# Patient Record
Sex: Male | Born: 1980 | Race: White | Hispanic: No | Marital: Single | Smoking: Current every day smoker
Health system: Southern US, Community
[De-identification: ages and names within clinical notes are randomized; demographics above are authoritative.]

## PROBLEM LIST (undated history)

## (undated) HISTORY — PX: DENTAL SURGERY: SHX609

---

## 2005-03-28 ENCOUNTER — Emergency Department: Payer: Self-pay | Admitting: Emergency Medicine

## 2007-02-06 ENCOUNTER — Emergency Department: Payer: Self-pay | Admitting: Emergency Medicine

## 2009-05-04 ENCOUNTER — Emergency Department: Payer: Self-pay

## 2012-03-25 ENCOUNTER — Emergency Department: Payer: Self-pay | Admitting: Emergency Medicine

## 2012-03-27 LAB — BETA STREP CULTURE(ARMC)

## 2012-06-25 ENCOUNTER — Emergency Department: Payer: Self-pay | Admitting: Emergency Medicine

## 2012-06-25 LAB — CBC
MCH: 31.1 pg (ref 26.0–34.0)
MCHC: 35.4 g/dL (ref 32.0–36.0)
MCV: 88 fL (ref 80–100)
Platelet: 169 10*3/uL (ref 150–440)
RBC: 4.8 10*6/uL (ref 4.40–5.90)
RDW: 12.8 % (ref 11.5–14.5)
WBC: 12.2 10*3/uL — ABNORMAL HIGH (ref 3.8–10.6)

## 2012-06-25 LAB — URINALYSIS, COMPLETE
Bacteria: NONE SEEN
Bilirubin,UR: NEGATIVE
Blood: NEGATIVE
Ketone: NEGATIVE
Leukocyte Esterase: NEGATIVE
RBC,UR: 8 /HPF (ref 0–5)
Squamous Epithelial: NONE SEEN
WBC UR: 3 /HPF (ref 0–5)

## 2012-06-25 LAB — COMPREHENSIVE METABOLIC PANEL
Albumin: 4 g/dL (ref 3.4–5.0)
Alkaline Phosphatase: 95 U/L (ref 50–136)
Anion Gap: 10 (ref 7–16)
BUN: 10 mg/dL (ref 7–18)
Calcium, Total: 8.7 mg/dL (ref 8.5–10.1)
EGFR (Non-African Amer.): >60
SGOT(AST): 39 U/L — ABNORMAL HIGH (ref 15–37)
Sodium: 141 mmol/L (ref 136–145)
Total Protein: 7.4 g/dL (ref 6.4–8.2)

## 2012-06-25 LAB — DRUG SCREEN, URINE
Amphetamines, Ur Screen: NEGATIVE (ref ?–1000)
Barbiturates, Ur Screen: NEGATIVE (ref ?–200)
Benzodiazepine, Ur Scrn: NEGATIVE (ref ?–200)
Methadone, Ur Screen: NEGATIVE (ref ?–300)
Tricyclic, Ur Screen: NEGATIVE (ref ?–1000)

## 2014-04-09 ENCOUNTER — Emergency Department: Payer: Self-pay | Admitting: Emergency Medicine

## 2014-04-14 ENCOUNTER — Emergency Department: Payer: Self-pay | Admitting: Emergency Medicine

## 2014-04-14 LAB — URINALYSIS, COMPLETE
Bacteria: NONE SEEN
Bilirubin,UR: NEGATIVE
Blood: NEGATIVE
GLUCOSE, UR: NEGATIVE mg/dL (ref 0–75)
Ketone: NEGATIVE
LEUKOCYTE ESTERASE: NEGATIVE
Nitrite: NEGATIVE
PH: 5 (ref 4.5–8.0)
PROTEIN: NEGATIVE
SPECIFIC GRAVITY: 1.024 (ref 1.003–1.030)
WBC UR: 1 /HPF (ref 0–5)

## 2014-04-14 LAB — ACETAMINOPHEN LEVEL: Acetaminophen: <2 ug/mL

## 2014-04-14 LAB — CBC
HCT: 44.3 % (ref 40.0–52.0)
HGB: 15.1 g/dL (ref 13.0–18.0)
MCH: 31 pg (ref 26.0–34.0)
MCHC: 34 g/dL (ref 32.0–36.0)
MCV: 91 fL (ref 80–100)
PLATELETS: 223 10*3/uL (ref 150–440)
RBC: 4.86 10*6/uL (ref 4.40–5.90)
RDW: 13.2 % (ref 11.5–14.5)
WBC: 11.7 10*3/uL — AB (ref 3.8–10.6)

## 2014-04-14 LAB — DRUG SCREEN, URINE

## 2014-04-14 LAB — COMPREHENSIVE METABOLIC PANEL
ALK PHOS: 92 U/L
AST: 28 U/L (ref 15–37)
Albumin: 4.1 g/dL (ref 3.4–5.0)
Anion Gap: 5 — ABNORMAL LOW (ref 7–16)
BILIRUBIN TOTAL: 0.5 mg/dL (ref 0.2–1.0)
BUN: 21 mg/dL — AB (ref 7–18)
CO2: 28 mmol/L (ref 21–32)
Calcium, Total: 8.8 mg/dL (ref 8.5–10.1)
Chloride: 105 mmol/L (ref 98–107)
Creatinine: 0.96 mg/dL (ref 0.60–1.30)
EGFR (African American): >60
Glucose: 83 mg/dL (ref 65–99)
Osmolality: 278 (ref 275–301)
Potassium: 4.2 mmol/L (ref 3.5–5.1)
SGPT (ALT): 27 U/L
Sodium: 138 mmol/L (ref 136–145)
Total Protein: 8.2 g/dL (ref 6.4–8.2)

## 2014-04-14 LAB — ETHANOL
Ethanol %: 0.006 % (ref 0.000–0.080)
Ethanol: 6 mg/dL

## 2014-04-14 LAB — SALICYLATE LEVEL: SALICYLATES, SERUM: 2.6 mg/dL

## 2014-05-14 ENCOUNTER — Emergency Department: Payer: Self-pay | Admitting: Emergency Medicine
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
## 2016-11-08 ENCOUNTER — Emergency Department
Admission: EM | Admit: 2016-11-08 | Discharge: 2016-11-08 | Disposition: A | Discharge status: Home / Self Care | Payer: PRIVATE HEALTH INSURANCE | Attending: Emergency Medicine | Admitting: Emergency Medicine

## 2016-11-08 ENCOUNTER — Encounter: Payer: Self-pay | Admitting: Emergency Medicine

## 2016-11-08 DIAGNOSIS — K05219 Aggressive periodontitis, localized, unspecified severity: Secondary | ICD-10-CM

## 2016-11-08 DIAGNOSIS — K0889 Other specified disorders of teeth and supporting structures: Secondary | ICD-10-CM | POA: Diagnosis present

## 2016-11-08 DIAGNOSIS — K659 Peritonitis, unspecified: Secondary | ICD-10-CM | POA: Insufficient documentation

## 2016-11-08 MED ORDER — AMOXICILLIN 500 MG PO TABS: 500.0000 mg | ORAL_TABLET | Freq: Three times a day (TID) | ORAL | 0 refills | Status: DC

## 2016-11-08 MED ORDER — TRAMADOL HCL 50 MG PO TABS: 50.0000 mg | ORAL_TABLET | Freq: Four times a day (QID) | ORAL | 0 refills | Status: DC | PRN

## 2016-11-08 NOTE — ED Provider Notes (Signed)
[facility] Emergency Department Provider Note ____________________________________________  Time seen: Approximately 9:52 PM  I have reviewed the triage vital signs and the nursing notes.   HISTORY  Chief Complaint Dental Pain   HPI DAGAN HEINZ is a 36 y.o. male who presents to the emergency department for evaluation of dental pain. He had his wisdom teeth removed about 6 months ago and for the past week or so, he has had pain, swelling, and a small recurrent abscess that bursts if he touches it was tongue. Pain has increased over the past 24 hours. He has had no facial swelling. He states that he has a pressure-like feeling as if there is a piece of bone or tooth that is trying to push through the gum.  History reviewed. No pertinent past medical history.  There are no active problems to display for this patient.   Past Surgical History:  Procedure Laterality Date  . DENTAL SURGERY      Prior to Admission medications   Medication Sig Start Date End Date Taking? Authorizing Provider  amoxicillin (AMOXIL) 500 MG tablet Take 1 tablet (500 mg total) by mouth 3 (three) times daily. 11/08/16   Chinita Pester, FNP  traMADol (ULTRAM) 50 MG tablet Take 1 tablet (50 mg total) by mouth every 6 (six) hours as needed. 11/08/16   Chinita Pester, FNP    Allergies Patient has no known allergies.  No family history on file.  Social History Social History  Substance Use Topics  . Smoking status: Not on file  . Smokeless tobacco: Not on file  . Alcohol use Not on file    Review of Systems Constitutional: Well appearing. Negative for fever. ENT: Eyes negative for gingival pain and swelling. Musculoskeletal: Negative for jaw pain/trismus. Skin: Negative for facial erythema or swelling. ____________________________________________   PHYSICAL EXAM:  VITAL SIGNS: ED Triage Vitals  Enc Vitals Group     BP 11/08/16 2133 140/80     Pulse Rate 11/08/16 2133  98     Resp 11/08/16 2133 20     Temp 11/08/16 2133 99 F (37.2 C)     Temp Source 11/08/16 2133 Oral     SpO2 11/08/16 2133 96 %     Weight 11/08/16 2133 200 lb (90.7 kg)     Height 11/08/16 2133 5\' 10"  (1.778 m)     Head Circumference --      Peak Flow --      Pain Score 11/08/16 2132 7     Pain Loc --      Pain Edu? --      Excl. in GC? --     Constitutional: Alert and oriented. Well appearing and in no acute distress. Eyes: Conjunctivae are normal.EOMI. Mouth/Throat: Mucous membranes are moist. Oropharynx non-erythematous. Periodontal Exam    Hematological/Lymphatic/Immunilogical: No cervical lymphadenopathy. Respiratory: Normal respiratory effort.  Musculoskeletal: Full ROM x 4 extremities. Neurologic:  Normal speech and language. No gross focal neurologic deficits are appreciated. Speech is normal. No gait instability. Psychiatric: Mood and affect are normal. Speech and behavior are normal.  ____________________________________________   LABS (all labs ordered are listed, but only abnormal results are displayed)  Labs Reviewed - No data to display ____________________________________________   RADIOLOGY  Not indicated ____________________________________________   PROCEDURES  Procedure(s) performed: None  Critical Care performed: No  ____________________________________________   INITIAL IMPRESSION / ASSESSMENT AND PLAN / ED COURSE  Pertinent labs & imaging results that were available during my care of  the patient were reviewed by me and considered in my medical decision making (see chart for details).  36 year old male presenting to the emergency department for treatment of gingival pain and abscess. He was given a prescription for amoxicillin and tramadol and advised to call his oral surgeon for follow-up. He was advised to call the dentist if he is unable to get an appointment with the oral surgeon. He was advised to take the antibiotic as prescribed.  He was instructed to return to the emergency department for symptoms that change or worsen if he is unable schedule an appointment. ____________________________________________   FINAL CLINICAL IMPRESSION(S) / ED DIAGNOSES  Final diagnoses:  Gingival abscess    Note:  This document was prepared using Dragon voice recognition software and may include unintentional dictation errors.    Chinita PesterCari B Mateen Franssen, FNP 11/08/16 2315    Merrily BrittleNeil Rifenbark, MD 11/08/16 2350

## 2016-11-08 NOTE — ED Triage Notes (Signed)
Pt presents to ED with c/o right sided dental pain. Pt states he had a wisdom tooth extracted about 6 months ago and for the past week or so he has noticed an increase in swelling and pain to the same area.

## 2016-11-08 NOTE — Discharge Instructions (Signed)
Rinse your mouth out 4 times per day with warm salt water. Call and schedule an appointment with the oral surgeon. Take the antibiotic until finished. Return to the ER for symptoms that change or worsen if you are unable to schedule an appointment.

## 2016-11-08 NOTE — ED Notes (Signed)
Patient had wisdom teeth removed about 8-9 months ago. For the last week having pain in the upper right jaw, feels like something protruding down. Wants something for the pain and for us to refer him to a nice dentist.

## 2017-05-29 ENCOUNTER — Emergency Department
Admission: EM | Admit: 2017-05-29 | Discharge: 2017-05-31 | Disposition: A | Discharge status: Other Acute Inpatient Hospital | Payer: Medicaid - Out of State | Attending: Emergency Medicine | Admitting: Emergency Medicine

## 2017-05-29 DIAGNOSIS — F29 Unspecified psychosis not due to a substance or known physiological condition: Secondary | ICD-10-CM | POA: Diagnosis present

## 2017-05-29 DIAGNOSIS — Z79899 Other long term (current) drug therapy: Secondary | ICD-10-CM | POA: Diagnosis not present

## 2017-05-29 DIAGNOSIS — F312 Bipolar disorder, current episode manic severe with psychotic features: Secondary | ICD-10-CM

## 2017-05-29 DIAGNOSIS — F17200 Nicotine dependence, unspecified, uncomplicated: Secondary | ICD-10-CM | POA: Diagnosis not present

## 2017-05-29 LAB — COMPREHENSIVE METABOLIC PANEL
ALT: 21 U/L (ref 17–63)
AST: 28 U/L (ref 15–41)
Albumin: 4.8 g/dL (ref 3.5–5.0)
Alkaline Phosphatase: 67 U/L (ref 38–126)
Anion gap: 11 (ref 5–15)
BUN: 17 mg/dL (ref 6–20)
CHLORIDE: 103 mmol/L (ref 101–111)
CO2: 24 mmol/L (ref 22–32)
Calcium: 9.4 mg/dL (ref 8.9–10.3)
Creatinine, Ser: 1.03 mg/dL (ref 0.61–1.24)
GFR calc Af Amer: >60 mL/min (ref >60)
Glucose, Bld: 77 mg/dL (ref 65–99)
Potassium: 3.6 mmol/L (ref 3.5–5.1)
SODIUM: 138 mmol/L (ref 135–145)
Total Bilirubin: 1 mg/dL (ref 0.3–1.2)
Total Protein: 8.1 g/dL (ref 6.5–8.1)

## 2017-05-29 LAB — ACETAMINOPHEN LEVEL: Acetaminophen (Tylenol), Serum: <10 ug/mL — ABNORMAL LOW (ref 10–30)

## 2017-05-29 LAB — CBC
HEMATOCRIT: 41.8 % (ref 40.0–52.0)
Hemoglobin: 14.7 g/dL (ref 13.0–18.0)
MCH: 29.7 pg (ref 26.0–34.0)
MCHC: 35.1 g/dL (ref 32.0–36.0)
MCV: 84.5 fL (ref 80.0–100.0)
Platelets: 233 10*3/uL (ref 150–440)
RBC: 4.95 MIL/uL (ref 4.40–5.90)
RDW: 13.3 % (ref 11.5–14.5)
WBC: 15.3 10*3/uL — AB (ref 3.8–10.6)

## 2017-05-29 LAB — ETHANOL: Alcohol, Ethyl (B): <5 mg/dL (ref <5)

## 2017-05-29 LAB — SALICYLATE LEVEL: Salicylate Lvl: <7 mg/dL (ref 2.8–30.0)

## 2017-05-29 LAB — VALPROIC ACID LEVEL

## 2017-05-29 MED ORDER — LORAZEPAM 2 MG/ML IJ SOLN
2.0000 mg | Freq: Once | INTRAMUSCULAR | Status: AC
  Administered 2017-05-29: 2 mg via INTRAMUSCULAR

## 2017-05-29 MED ORDER — ZIPRASIDONE MESYLATE 20 MG IM SOLR
INTRAMUSCULAR | Status: AC
  Filled 2017-05-29: qty 20

## 2017-05-29 MED ORDER — TUBERCULIN PPD 5 UNIT/0.1ML ID SOLN
5.0000 [IU] | Freq: Once | INTRADERMAL | Status: DC
  Filled 2017-05-29: qty 0.1

## 2017-05-29 MED ORDER — ZIPRASIDONE MESYLATE 20 MG IM SOLR
20.0000 mg | Freq: Once | INTRAMUSCULAR | Status: AC
  Administered 2017-05-29: 20 mg via INTRAMUSCULAR

## 2017-05-29 MED ORDER — LORAZEPAM 2 MG/ML IJ SOLN
INTRAMUSCULAR | Status: AC
  Filled 2017-05-29: qty 1

## 2017-05-29 MED ORDER — HALOPERIDOL LACTATE 5 MG/ML IJ SOLN: 5.0000 mg | Freq: Once | INTRAMUSCULAR | Status: DC

## 2017-05-29 MED ORDER — LORAZEPAM 2 MG/ML IJ SOLN: 2.0000 mg | Freq: Once | INTRAMUSCULAR | Status: DC

## 2017-05-29 NOTE — ED Notes (Signed)
BEHAVIORAL HEALTH ROUNDING Patient sleeping: Yes.   Patient alert and oriented: eyes closed  Appears to be asleep Behavior appropriate: Yes.  ; If no, describe:  Nutrition and fluids offered: Yes  Toileting and hygiene offered: sleeping Sitter present: q 15 minute observations and security monitoring Law enforcement present: yes  ODS 

## 2017-05-29 NOTE — ED Notes (Signed)
Pt placed into 20 hallway bed so that the psychiatrist may talk with him
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
## 2017-05-29 NOTE — ED Notes (Signed)
Dave ED medic set up for pt to take a shower. Pt in shower room. [employer] at bedside. Pt asked them to stay at this time. Pt is voluntary, cooperative.

## 2017-05-29 NOTE — ED Notes (Signed)
Pt ambulatory from 1H to 21. Wanded by Technical sales engineerofficer. Pt wanted to leave, wanted to know what drugs are in his blood. Pt informed that not all tests are back yet. Dr. Alphonzo LemmingsMcShane talked to pt d/t IVC status. Pt seems on edge, upset about having to stay at this time. Pt informed that psychiatrist will be seeing him shortly. Pt agreed to go to room 21.   Pt belongings taken from pt and placed in labeled bag- pair of khaki pants, underwear, brown belt, cell phone (turned off), wallet, loose change.

## 2017-05-29 NOTE — BH Assessment (Signed)
Unable to assess the patient due to him being sedated.  Will attempt to assess the patient at a later time.

## 2017-05-29 NOTE — ED Notes (Signed)
PT continued sleeping and poses no danger remaining in hall at this time.  Pt was able to iopen eyes and follow commands for vital check but remains drowsy.

## 2017-05-29 NOTE — ED Notes (Signed)
He is standing in the hallway in front of room 21  I introduced myself and encouraged him to go into the room - pt refuses  "I am just fine right here."  He denies pain  Continue to monitor

## 2017-05-29 NOTE — ED Provider Notes (Addendum)
[facility] Emergency Department Provider Note  ____________________________________________   I have reviewed the triage vital signs and the nursing notes.   HISTORY  Chief Complaint Psychiatric Evaluation    HPI Brandon Dodson is a 36 y.o. male who presents today complaining that he is being attacked by "chemicals". He states that he has not slept in 3 days. He is bipolar. He has not been taking his medication. He is supposed to be on apical. He will not give me any details about the alleged chemicals. He states that they are in his car, and in his house. He can feel them and taste them. Family states there is no such chemical exposure. His brother-in-law was trying to take out on voluntary paper work on him as the police brought him in. Patient has not endorsed SI or HI to me. FI discussed with his mother, Miss Mayford Knife, who states that the patient has a history of psychotic breaks and she had to take out IVC paperwork on him a year ago at which time he spent 2 weeks in a psychiatric facility for psychosis. Patient at that time made threats to his mother and was hiding behind the house. In addition,his girlfriend, his name is Clotilde Dieter, tells me on the phone that she is afraid for her safety because he is convinced that she is trying to kill him.       History reviewed. No pertinent past medical history.  There are no active problems to display for this patient.   Past Surgical History:  Procedure Laterality Date  . DENTAL SURGERY      Prior to Admission medications   Medication Sig Start Date End Date Taking? Authorizing Provider  amoxicillin (AMOXIL) 500 MG tablet Take 1 tablet (500 mg total) by mouth 3 (three) times daily. 11/08/16   Triplett, Rulon Eisenmenger B, FNP  traMADol (ULTRAM) 50 MG tablet Take 1 tablet (50 mg total) by mouth every 6 (six) hours as needed. 11/08/16   Chinita Pester, FNP    Allergies Patient has no known allergies.  History reviewed. No  pertinent family history.  Social History Social History  Substance Use Topics  . Smoking status: Current Every Day Smoker  . Smokeless tobacco: Not on file  . Alcohol use Yes    Review of Systems Constitutional: No fever/chills Eyes: No visual changes. ENT: No sore throat. No stiff neck no neck pain Cardiovascular: Denies chest pain. Respiratory: Denies shortness of breath. Gastrointestinal:   no vomiting.  No diarrhea.  No constipation. Genitourinary: Negative for dysuria. Musculoskeletal: Negative lower extremity swelling Skin: Negative for rash. Neurological: Negative for severe headaches, focal weakness or numbness.   ____________________________________________   PHYSICAL EXAM:  VITAL SIGNS: ED Triage Vitals [05/29/17 1432]  Enc Vitals Group     BP (!) 148/90     Pulse Rate 91     Resp 18     Temp 98.2 F (36.8 C)     Temp Source Oral     SpO2 93 %     Weight 180 lb (81.6 kg)     Height  (1.778 m)     Head Circumference      Peak Flow      Pain Score 0     Pain Loc      Pain Edu?      Excl. in GC?     Constitutional: Alert and oriented. Well appearing and in no acute distress. Eyes: Conjunctivae are normal Head: Atraumatic HEENT:  No congestion/rhinnorhea. Mucous membranes are moist.  Oropharynx non-erythematous Neck:   Nontender with no meningismus, no masses, no stridor Cardiovascular: Normal rate, regular rhythm. Grossly normal heart sounds.  Good peripheral circulation. Respiratory: Normal respiratory effort.  No retractions. Lungs CTAB. Abdominal: Soft and nontender. No distention. No guarding no rebound Back:  There is no focal tenderness or step off.  there is no midline tenderness there are no lesions noted. there is no CVA tenderness  Musculoskeletal: No lower extremity tenderness, no upper extremity tenderness. No joint effusions, no DVT signs strong distal pulses no edema Neurologic:  Normal speech and language. No gross focal  neurologic deficits are appreciated.  Skin:  Skin is warm, dry and intact. No rash noted. Psychiatric: Mood and affect are flight of ideas, random associations are being made and paranoid ideas are expressed. Speech and behavior are normal.  ____________________________________________   LABS (all labs ordered are listed, but only abnormal results are displayed)  Labs Reviewed  CBC - Abnormal; Notable for the following:       Result Value   WBC 15.3 (*)    All other components within normal limits  COMPREHENSIVE METABOLIC PANEL  ETHANOL  URINE DRUG SCREEN, QUALITATIVE ([facility] ONLY)  ACETAMINOPHEN LEVEL  SALICYLATE LEVEL  VALPROIC ACID LEVEL   ____________________________________________  EKG  I personally interpreted any EKGs ordered by me or triage   ____________________________________________  RADIOLOGY  I reviewed any imaging ordered by me or triage that were performed during my shift and, if possible, patient and/or family made aware of any abnormal findings. ____________________________________________   PROCEDURES  Procedure(s) performed: None  Procedures  Critical Care performed: None  ____________________________________________   INITIAL IMPRESSION / ASSESSMENT AND PLAN / ED COURSE  Pertinent labs & imaging results that were available during my care of the patient were reviewed by me and considered in my medical decision making (see chart for details).  patient here with what appears to be decompensated mania/bipolar with psychotic features, patient has not slept in 3 days is off of his medications, he is frightening the people around him, he has paranoid delusions, we have taken out involuntary commitment paperwork on the patient and we will observe him here pending psychiatric disposition. Of note there is no evidence of chemical exposure, and the patient states that he feels a chemical  attack has again been perpetrated on him after his shower that he  already took here. Pt very upset about the prospect of ivc. Aggressive. Refusing to go to room. Standing very close to this provider, requiring large police presence.  ----------------------------------------- 4:57 PM on 05/29/2017 ----------------------------------------- Seen and evaluated by Dr. Toni Amendlapacs, who feels the pt is an immanent threat to staff and he requests geodon and ativan, which we will give.  ----------------------------------------- 9:53 PM on 05/29/2017 -----------------------------------------  recent, who is very agitated and has not slept in 3 days, after medication is been sleeping peacefully and I have checked on him many times. He'll be signed out at 11 PM.    ____________________________________________   FINAL CLINICAL IMPRESSION(S) / ED DIAGNOSES  Final diagnoses:  None      This chart was dictated using voice recognition software.  Despite best efforts to proofread,  errors can occur which can change meaning.       Jeanmarie PlantMcShane, Kip Kautzman A, MD 05/29/17 1617    Jeanmarie PlantMcShane, Haidy Kackley A, MD 05/29/17 1659    Jeanmarie PlantMcShane, Mavric Cortright A, MD 05/29/17 2153

## 2017-05-29 NOTE — Consult Note (Signed)
[place] [facility] Consult   Reason for Consult:  Consult for 36 year old man brought to the emergency room by police after being found in public acting strangely Referring Physician:  McShane Patient Identification: CHRISTOFER SHEN MRN:  [id number] Principal Diagnosis: Bipolar affective disorder, manic, severe, with psychotic behavior ([place]) Diagnosis:   Patient Active Problem List   Diagnosis Date Noted  . Bipolar affective disorder, manic, severe, with psychotic behavior ([place]) [F31.2] 05/29/2017    Total Time spent with patient: 1 hour  Subjective:   KRISTION HOLIFIELD is a 36 y.o. male patient admitted with "it was a chemical exposure".  HPI:  Patient interviewed chart reviewed. Spoke with emergency room physician and other emergency room staff. The police reportedly found this gentleman somewhere in public perhaps a church acting strangely and claiming that he had been attacked with chemicals. I'm uncertain whether there was actually some kind of sticky substance on him at the time but the patient seemed disturbed and seemed to be implying that it was causing him some trouble. He was brought to the emergency room by police. He has been acting paranoid and bizarre since being here. He told the emergency room physician that we should just go ahead and tell him what is in his blood because he knows that we know what it is. On interview with me the patient is uncooperative. He states that he does not need to see a psychiatrist. He did tell me that there was an aerosolized liquid that had gotten on him. He would not answer any of my questions about it. He would not answer any further questions about symptoms and became increasingly paranoid and defensive. Collateral information from the family is that he has been acting more paranoid odd and threatening around his family. Apparently his girlfriend communicated that she is afraid of him because he has made bizarre paranoid statements to her. As  far as we know he is not on any medication. He will not answer questions about substances but there is no direct evidence right now of substance abuse.  Social history: Not entirely clear where he is living. He apparently has family and a girlfriend here in town.  Medical history: No known medical problems outside of the psychiatric  Substance abuse history: Patient has denied substance abuse problems in the past. Unclear if there is any prior documentation of it. Not answering questions about that or anything else right now.  Past Psychiatric History: Patient has been seen once previously at our hospital in 2015 under similar circumstances. At that time we got the information that he had a diagnosis of bipolar disorder when he was in prison. Family communicated at that time the patient felt he was high risk for violence. Ultimately however the patient became somewhat more cooperative and I believe he was discharged at that time. I'm told that his family says he has had inpatient hospitalizations and been on medicine in the past. No known suicide attempts.  Risk to Self: Is patient at risk for suicide?: No, but patient needs Medical Clearance Risk to Others:   Prior Inpatient Therapy:   Prior Outpatient Therapy:    Past Medical History: History reviewed. No pertinent past medical history.  Past Surgical History:  Procedure Laterality Date  . DENTAL SURGERY     Family History: History reviewed. No pertinent family history. Family Psychiatric  History: Unknown Social History:  History  Alcohol Use  . Yes     History  Drug use: Unknown  Social History   Social History  . Marital status: Single    Spouse name: N/A  . Number of children: N/A  . Years of education: N/A   Social History Main Topics  . Smoking status: Current Every Day Smoker  . Smokeless tobacco: None  . Alcohol use Yes  . Drug use: Unknown  . Sexual activity: Not Asked   Other Topics Concern  . None    Social History Narrative  . None   Additional Social History:    Allergies:  No Known Allergies  Labs:  Results for orders placed or performed during the hospital encounter of 05/29/17 (from the past 48 hour(s))  Comprehensive metabolic panel     Status: None   Collection Time: 05/29/17  2:41 PM  Result Value Ref Range   Sodium 138 135 - 145 mmol/L   Potassium 3.6 3.5 - 5.1 mmol/L   Chloride 103 101 - 111 mmol/L   CO2 24 22 - 32 mmol/L   Glucose, Bld 77 65 - 99 mg/dL   BUN 17 6 - 20 mg/dL   Creatinine, Ser 1.03 0.61 - 1.24 mg/dL   Calcium 9.4 8.9 - 10.3 mg/dL   Total Protein 8.1 6.5 - 8.1 g/dL   Albumin 4.8 3.5 - 5.0 g/dL   AST 28 15 - 41 U/L   ALT 21 17 - 63 U/L   Alkaline Phosphatase 67 38 - 126 U/L   Total Bilirubin 1.0 0.3 - 1.2 mg/dL   GFR calc non Af Amer >60 >60 mL/min   GFR calc Af Amer >60 >60 mL/min    Comment: (NOTE) The eGFR has been calculated using the CKD EPI equation. This calculation has not been validated in all clinical situations. eGFR's persistently <60 mL/min signify possible Chronic Kidney Disease.    Anion gap 11 5 - 15  Ethanol     Status: None   Collection Time: 05/29/17  2:41 PM  Result Value Ref Range   Alcohol, Ethyl (B) <5 <5 mg/dL    Comment:        LOWEST DETECTABLE LIMIT FOR SERUM ALCOHOL IS 5 mg/dL FOR MEDICAL PURPOSES ONLY   cbc     Status: Abnormal   Collection Time: 05/29/17  2:41 PM  Result Value Ref Range   WBC 15.3 (H) 3.8 - 10.6 K/uL   RBC 4.95 4.40 - 5.90 MIL/uL   Hemoglobin 14.7 13.0 - 18.0 g/dL   HCT 41.8 40.0 - 52.0 %   MCV 84.5 80.0 - 100.0 fL   MCH 29.7 26.0 - 34.0 pg   MCHC 35.1 32.0 - 36.0 g/dL   RDW 13.3 11.5 - 14.5 %   Platelets 233 150 - 440 K/uL  Acetaminophen level     Status: Abnormal   Collection Time: 05/29/17  2:41 PM  Result Value Ref Range   Acetaminophen (Tylenol), Serum <10 (L) 10 - 30 ug/mL    Comment:        THERAPEUTIC CONCENTRATIONS VARY SIGNIFICANTLY. A RANGE OF 10-30 ug/mL MAY BE AN  EFFECTIVE CONCENTRATION FOR MANY PATIENTS. HOWEVER, SOME ARE BEST TREATED AT CONCENTRATIONS OUTSIDE THIS RANGE. ACETAMINOPHEN CONCENTRATIONS >150 ug/mL AT 4 HOURS AFTER INGESTION AND >50 ug/mL AT 12 HOURS AFTER INGESTION ARE OFTEN ASSOCIATED WITH TOXIC REACTIONS.   Salicylate level     Status: None   Collection Time: 05/29/17  2:41 PM  Result Value Ref Range   Salicylate Lvl <0.5 2.8 - 30.0 mg/dL  Valproic acid level     Status:  Abnormal   Collection Time: 05/29/17  2:41 PM  Result Value Ref Range   Valproic Acid Lvl <10 (L) 50.0 - 100.0 ug/mL    Current Facility-Administered Medications  Medication Dose Route Frequency Provider Last Rate Last Dose  . LORazepam (ATIVAN) 2 MG/ML injection           . LORazepam (ATIVAN) injection 2 mg  2 mg Intramuscular Once Schuyler Amor, MD      . tuberculin injection 5 Units  5 Units Intradermal Once Schuyler Amor, MD      . ziprasidone (GEODON) 20 MG injection           . ziprasidone (GEODON) injection 20 mg  20 mg Intramuscular Once Schuyler Amor, MD       Current Outpatient Prescriptions  Medication Sig Dispense Refill  . amoxicillin (AMOXIL) 500 MG tablet Take 1 tablet (500 mg total) by mouth 3 (three) times daily. (Patient not taking: Reported on 05/29/2017) 30 tablet 0  . traMADol (ULTRAM) 50 MG tablet Take 1 tablet (50 mg total) by mouth every 6 (six) hours as needed. (Patient not taking: Reported on 05/29/2017) 12 tablet 0    Musculoskeletal: Strength & Muscle Tone: within normal limits Gait & Station: normal Patient leans: N/A  Psychiatric Specialty Exam: Physical Exam  Nursing note and vitals reviewed. Constitutional: He appears well-developed and well-nourished.  HENT:  Head: Normocephalic and atraumatic.  Eyes: Pupils are equal, round, and reactive to light. Conjunctivae are normal.  Neck: Normal range of motion.  Cardiovascular: Regular rhythm and normal heart sounds.   Respiratory: Effort normal. No respiratory  distress.  GI: Soft.  Musculoskeletal: Normal range of motion.  Neurological: He is alert.  Skin: Skin is warm and dry.  Psychiatric: His affect is blunt and inappropriate. His speech is delayed. He is withdrawn. Thought content is paranoid and delusional. Cognition and memory are impaired. He expresses inappropriate judgment.    Review of Systems  Constitutional: Negative.   HENT: Negative.   Eyes: Negative.   Respiratory: Negative.   Cardiovascular: Negative.   Gastrointestinal: Negative.   Musculoskeletal: Negative.   Skin: Negative.   Neurological: Negative.   Psychiatric/Behavioral: Negative.     Blood pressure (!) 148/90, pulse 91, temperature 98.2 F (36.8 C), temperature source Oral, resp. rate 18, height '5\' 10"'  (1.778 m), weight 81.6 kg (180 lb), SpO2 93 %.Body mass index is 25.83 kg/m.  General Appearance: Fairly Groomed  Eye Contact:  Good  Speech:  Blocked  Volume:  Decreased  Mood:  Euthymic  Affect:  Blunt and Inappropriate  Thought Process:  Descriptions of Associations: Circumstantial  Orientation:  Full (Time, Place, and Person)  Thought Content:  Illogical and Paranoid Ideation  Suicidal Thoughts:  No  Homicidal Thoughts:  Unknown  Memory:  Negative  Judgement:  Impaired  Insight:  Lacking  Psychomotor Activity:  Restlessness  Concentration:  Concentration: Poor  Recall:  Poor  Fund of Knowledge:  Good  Language:  Good  Akathisia:  Negative  Handed:  Right  AIMS (if indicated):     Assets:  Housing Physical Health  ADL's:  Intact  Cognition:  WNL  Sleep:        Treatment Plan Summary: Daily contact with patient to assess and evaluate symptoms and progress in treatment, Medication management and Plan 36 year old man who appears to be very paranoid and confused making psychotic statements. Past history of bipolar disorder. Secondhand reports a past history of violence. Patient is very uncooperative. I  agreed that he is at some risk of violent  behavior and meets commitment criteria. Case reviewed with emergency room physician. He has been given some antipsychotic and sedating medicine. Ultimately we will plan for admission if he can be stabilized. Labs are still pending at this point.  Disposition: Recommend psychiatric Inpatient admission when medically cleared. Supportive therapy provided about ongoing stressors.  Alethia Berthold, MD 05/29/2017 5:18 PM

## 2017-05-30 MED ORDER — LORAZEPAM 2 MG PO TABS
2.0000 mg | ORAL_TABLET | Freq: Once | ORAL | Status: AC
  Administered 2017-05-30: 2 mg via ORAL
  Filled 2017-05-30: qty 1

## 2017-05-30 MED ORDER — OLANZAPINE 10 MG PO TBDP
10.0000 mg | ORAL_TABLET | Freq: Every day | ORAL | Status: DC
  Administered 2017-05-30 (×2): 10 mg via ORAL
  Filled 2017-05-30 (×2): qty 1

## 2017-05-30 NOTE — ED Notes (Addendum)
Pt easily aroused from sleep for lunch. Patient encouraged to eat lunch while it was warm. Pt offered remote but declined. Pt guarded and withdrawn for most of the shift, stating, "I feel fine", and denies SI/HI at this time. Pt remains safe with 15 minute checks

## 2017-05-30 NOTE — BH Assessment (Signed)
Pt pending [facility] BMU bed assignment.

## 2017-05-30 NOTE — BH Assessment (Signed)
Assessment Note  Brandon Dodson is an 36 y.o. male. The patient came in after thinking chemicals were on him, although there were not any chemicals on him.  The patient denied any problems sleeping.  However, he told the MD he has not slept for about 3 days.  According to notes, the patient's girlfriend reported she is afraid for her safety due to the patient being convinced she is trying to kill him.  The patient was hospitalized about a year ago for psychosis.  He was also at [facility] about 3 years ago for mental health reasons.  He denies SI, HI, SA and psychosis.  Diagnosis: Bipolar I Disorder  Past Medical History: History reviewed. No pertinent past medical history.  Past Surgical History:  Procedure Laterality Date  . DENTAL SURGERY      Family History: History reviewed. No pertinent family history.  Social History:  reports that he has been smoking.  He does not have any smokeless tobacco history on file. He reports that he drinks alcohol. His drug history is not on file.  Additional Social History:  Alcohol / Drug Use Pain Medications: See PTA Prescriptions: See PTA Over the Counter: See PTA History of alcohol / drug use?: No history of alcohol / drug abuse  CIWA: CIWA-Ar BP: (!) 103/59 Pulse Rate: 87 COWS:    Allergies: No Known Allergies  Home Medications:  (Not in a hospital admission)  OB/GYN Status:  No LMP for male patient.  General Assessment Data Location of Assessment: [facility] ED TTS Assessment: In system Is this a Tele or Face-to-Face Assessment?: Face-to-Face Is this an Initial Assessment or a Re-assessment for this encounter?: Initial Assessment Marital status: Single Maiden name: NA Living Arrangements: Alone Can pt return to current living arrangement?: Yes Admission Status: Involuntary Is patient capable of signing voluntary admission?: No Referral Source: Self/Family/Friend Insurance type: Unknown     Crisis Care Plan Living Arrangements:  Alone Legal Guardian: Other: (Self) Name of Psychiatrist: none Name of Therapist: none  Education Status Is patient currently in school?: No Current Grade: NA Highest grade of school patient has completed: HS Diploma Name of school: NA Contact person: NA  Risk to self with the past 6 months Suicidal Ideation: No Has patient been a risk to self within the past 6 months prior to admission? : No Suicidal Intent: No Has patient had any suicidal intent within the past 6 months prior to admission? : No Is patient at risk for suicide?: No Suicidal Plan?: No Has patient had any suicidal plan within the past 6 months prior to admission? : No Access to Means: No What has been your use of drugs/alcohol within the last 12 months?: unknown Previous Attempts/Gestures: No How many times?: 0 Other Self Harm Risks: none Triggers for Past Attempts: None known Intentional Self Injurious Behavior: None Family Suicide History: No Recent stressful life event(s): Other (Comment) (none mentioned) Persecutory voices/beliefs?: No Depression: No Substance abuse history and/or treatment for substance abuse?: No Suicide prevention information given to non-admitted patients: Not applicable  Risk to Others within the past 6 months Homicidal Ideation: No Does patient have any lifetime risk of violence toward others beyond the six months prior to admission? : No Thoughts of Harm to Others: No Current Homicidal Intent: No Current Homicidal Plan: No Access to Homicidal Means: No Identified Victim: none History of harm to others?: No Assessment of Violence: None Noted Violent Behavior Description: none Does patient have access to weapons?: No Criminal Charges Pending?: No  Does patient have a court date: No Is patient on probation?: No  Psychosis Hallucinations: None noted Delusions: Somatic  Mental Status Report Appearance/Hygiene: In scrubs, Unremarkable Eye Contact: Good Motor Activity:  Unremarkable, Freedom of movement Speech: Logical/coherent Level of Consciousness: Alert Mood: Pleasant Affect: Appropriate to circumstance Anxiety Level: None Thought Processes: Coherent Judgement: Partial Orientation: Person, Place, Time, Situation, Appropriate for developmental age Obsessive Compulsive Thoughts/Behaviors: None  Cognitive Functioning Concentration: Normal Memory: Recent Intact, Remote Intact IQ: Average Insight: Poor Impulse Control: Poor Appetite: Good Weight Loss: 0 Weight Gain: 0 Sleep: No Change Total Hours of Sleep: 8 Vegetative Symptoms: None  ADLScreening [facility] Assessment Services) Patient's cognitive ability adequate to safely complete daily activities?: Yes Patient able to express need for assistance with ADLs?: Yes Independently performs ADLs?: Yes (appropriate for developmental age)  Prior Inpatient Therapy Prior Inpatient Therapy: No Prior Therapy Dates: none Prior Therapy Facilty/Provider(s): none Reason for Treatment: NA  Prior Outpatient Therapy Prior Outpatient Therapy: No Prior Therapy Dates: NA Prior Therapy Facilty/Provider(s): NA Reason for Treatment: NA Does patient have an ACCT team?: No Does patient have Intensive In-House Services?  : No Does patient have [facility] services? : No Does patient have P4CC services?: No  ADL Screening (condition at time of admission) Patient's cognitive ability adequate to safely complete daily activities?: Yes Is the patient deaf or have difficulty hearing?: No Does the patient have difficulty seeing, even when wearing glasses/contacts?: No Does the patient have difficulty concentrating, remembering, or making decisions?: No Patient able to express need for assistance with ADLs?: Yes Does the patient have difficulty dressing or bathing?: No Independently performs ADLs?: Yes (appropriate for developmental age) Does the patient have difficulty walking or climbing stairs?: No Weakness of Legs:  None Weakness of Arms/Hands: None     Therapy Consults (therapy consults require a physician order) PT Evaluation Needed: No OT Evalulation Needed: No SLP Evaluation Needed: No Abuse/Neglect Assessment (Assessment to be complete while patient is alone) Physical Abuse: Denies Verbal Abuse: Denies Sexual Abuse: Denies Exploitation of patient/patient's resources: Denies Self-Neglect: Denies Values / Beliefs Cultural Requests During Hospitalization: None Spiritual Requests During Hospitalization: None Consults Spiritual Care Consult Needed: No Social Work Consult Needed: No Merchant navy officerAdvance Directives (For Healthcare) Does Patient Have a Medical Advance Directive?: No Would patient like information on creating a medical advance directive?: No - Patient declined    Additional Information 1:1 In Past 12 Months?: No CIRT Risk: No Elopement Risk: No Does patient have medical clearance?: Yes     Disposition:  Disposition Initial Assessment Completed for this Encounter: Yes Disposition of Patient: Inpatient treatment program Type of inpatient treatment program: Adult  On Site Evaluation by:   Reviewed with Physician:    Ottis StainGarvin, Eliceo Gladu Jermaine 05/30/2017 1:54 AM

## 2017-05-30 NOTE — ED Provider Notes (Signed)
-----------------------------------------   7:11 AM on 05/30/2017 -----------------------------------------   Blood pressure (!) 103/59, pulse 87, temperature 98.7 F (37.1 C), temperature source Oral, resp. rate 18, height 5\' 10"  (1.778 m), weight 81.6 kg (180 lb), SpO2 97 %.  The patient had no acute events since last update.  Calm and cooperative at this time.  Disposition is pending Psychiatry/Behavioral Medicine team recommendations.     Merrily Brittleifenbark, Eustace Hur, MD 05/30/17 [phone]

## 2017-05-30 NOTE — ED Notes (Signed)
Dinner brought to patient 

## 2017-05-30 NOTE — ED Notes (Signed)
Patient requesting something to help him sleep.

## 2017-05-30 NOTE — ED Notes (Signed)
Patient sitting in dayroom requesting something for sleep.  Writer advised patient to give medication time to take affect.  Patient states he has been taking aerosol narcotics therefore this medication would not be strong enough.

## 2017-05-30 NOTE — ED Notes (Addendum)
Pt offered fluids and shower, but pt declines, stating, "I"m fine."

## 2017-05-30 NOTE — Consult Note (Signed)
[facility] Consult   Reason for Consult:  Consult for 36 year old man brought to the emergency room by police after being found in public acting strangely Referring Physician:  [place] Patient Identification: Brandon Dodson MRN:  [id number] Principal Diagnosis: Bipolar affective disorder, manic, severe, with psychotic behavior ([place]) Diagnosis:   Patient Active Problem List   Diagnosis Date Noted  . Bipolar affective disorder, manic, severe, with psychotic behavior ([place]) [F31.2] 05/29/2017    Total Time spent with patient: 20 minutes  Subjective:   Brandon Dodson is a 36 y.o. male patient admitted with "it was a chemical exposure".  Follow-up in the emergency room for Wednesday the 12th. Patient remains very withdrawn. He did take medication last night. Today however he still refuses explicitly to talk with me. Refuses to answer any questions about his recent situation. Affect remains withdrawn and guarded and seems paranoid. Has not been violent or aggressive or threatening today.  HPI:  Patient interviewed chart reviewed. Spoke with emergency room physician and other emergency room staff. The police reportedly found this gentleman somewhere in public perhaps a church acting strangely and claiming that he had been attacked with chemicals. I'm uncertain whether there was actually some kind of sticky substance on him at the time but the patient seemed disturbed and seemed to be implying that it was causing him some trouble. He was brought to the emergency room by police. He has been acting paranoid and bizarre since being here. He told the emergency room physician that we should just go ahead and tell him what is in his blood because he knows that we know what it is. On interview with me the patient is uncooperative. He states that he does not need to see a psychiatrist. He did tell me that there was an aerosolized liquid that had gotten on him. He would not answer any of my  questions about it. He would not answer any further questions about symptoms and became increasingly paranoid and defensive. Collateral information from the family is that he has been acting more paranoid odd and threatening around his family. Apparently his girlfriend communicated that she is afraid of him because he has made bizarre paranoid statements to her. As far as we know he is not on any medication. He will not answer questions about substances but there is no direct evidence right now of substance abuse.  Social history: Not entirely clear where he is living. He apparently has family and a girlfriend here in town.  Medical history: No known medical problems outside of the psychiatric  Substance abuse history: Patient has denied substance abuse problems in the past. Unclear if there is any prior documentation of it. Not answering questions about that or anything else right now.  Past Psychiatric History: Patient has been seen once previously at our hospital in 2015 under similar circumstances. At that time we got the information that he had a diagnosis of bipolar disorder when he was in prison. Family communicated at that time the patient felt he was high risk for violence. Ultimately however the patient became somewhat more cooperative and I believe he was discharged at that time. I'm told that his family says he has had inpatient hospitalizations and been on medicine in the past. No known suicide attempts.  Risk to Self: Suicidal Ideation: No Suicidal Intent: No Is patient at risk for suicide?: No Suicidal Plan?: No Access to Means: No What has been your use of drugs/alcohol within the last 12 months?:  unknown How many times?: 0 Other Self Harm Risks: none Triggers for Past Attempts: None known Intentional Self Injurious Behavior: None Risk to Others: Homicidal Ideation: No Thoughts of Harm to Others: No Current Homicidal Intent: No Current Homicidal Plan: No Access to Homicidal  Means: No Identified Victim: none History of harm to others?: No Assessment of Violence: None Noted Violent Behavior Description: none Does patient have access to weapons?: No Criminal Charges Pending?: No Does patient have a court date: No Prior Inpatient Therapy: Prior Inpatient Therapy: No Prior Therapy Dates: none Prior Therapy Facilty/Provider(s): none Reason for Treatment: NA Prior Outpatient Therapy: Prior Outpatient Therapy: No Prior Therapy Dates: NA Prior Therapy Facilty/Provider(s): NA Reason for Treatment: NA Does patient have an ACCT team?: No Does patient have Intensive In-House Services?  : No Does patient have [facility] services? : No Does patient have P4CC services?: No  Past Medical History: History reviewed. No pertinent past medical history.  Past Surgical History:  Procedure Laterality Date  . DENTAL SURGERY     Family History: History reviewed. No pertinent family history. Family Psychiatric  History: Unknown Social History:  History  Alcohol Use  . Yes     History  Drug use: Unknown    Social History   Social History  . Marital status: Single    Spouse name: N/A  . Number of children: N/A  . Years of education: N/A   Social History Main Topics  . Smoking status: Current Every Day Smoker  . Smokeless tobacco: None  . Alcohol use Yes  . Drug use: Unknown  . Sexual activity: Not Asked   Other Topics Concern  . None   Social History Narrative  . None   Additional Social History:    Allergies:  No Known Allergies  Labs:  Results for orders placed or performed during the hospital encounter of 05/29/17 (from the past 48 hour(s))  Comprehensive metabolic panel     Status: None   Collection Time: 05/29/17  2:41 PM  Result Value Ref Range   Sodium 138 135 - 145 mmol/L   Potassium 3.6 3.5 - 5.1 mmol/L   Chloride 103 101 - 111 mmol/L   CO2 24 22 - 32 mmol/L   Glucose, Bld 77 65 - 99 mg/dL   BUN 17 6 - 20 mg/dL   Creatinine, Ser 1.03  0.61 - 1.24 mg/dL   Calcium 9.4 8.9 - 10.3 mg/dL   Total Protein 8.1 6.5 - 8.1 g/dL   Albumin 4.8 3.5 - 5.0 g/dL   AST 28 15 - 41 U/L   ALT 21 17 - 63 U/L   Alkaline Phosphatase 67 38 - 126 U/L   Total Bilirubin 1.0 0.3 - 1.2 mg/dL   GFR calc non Af Amer >60 >60 mL/min   GFR calc Af Amer >60 >60 mL/min    Comment: (NOTE) The eGFR has been calculated using the CKD EPI equation. This calculation has not been validated in all clinical situations. eGFR's persistently <60 mL/min signify possible Chronic Kidney Disease.    Anion gap 11 5 - 15  Ethanol     Status: None   Collection Time: 05/29/17  2:41 PM  Result Value Ref Range   Alcohol, Ethyl (B) <5 <5 mg/dL    Comment:        LOWEST DETECTABLE LIMIT FOR SERUM ALCOHOL IS 5 mg/dL FOR MEDICAL PURPOSES ONLY   cbc     Status: Abnormal   Collection Time: 05/29/17  2:41 PM  Result  Value Ref Range   WBC 15.3 (H) 3.8 - 10.6 K/uL   RBC 4.95 4.40 - 5.90 MIL/uL   Hemoglobin 14.7 13.0 - 18.0 g/dL   HCT 41.8 40.0 - 52.0 %   MCV 84.5 80.0 - 100.0 fL   MCH 29.7 26.0 - 34.0 pg   MCHC 35.1 32.0 - 36.0 g/dL   RDW 13.3 11.5 - 14.5 %   Platelets 233 150 - 440 K/uL  Acetaminophen level     Status: Abnormal   Collection Time: 05/29/17  2:41 PM  Result Value Ref Range   Acetaminophen (Tylenol), Serum <10 (L) 10 - 30 ug/mL    Comment:        THERAPEUTIC CONCENTRATIONS VARY SIGNIFICANTLY. A RANGE OF 10-30 ug/mL MAY BE AN EFFECTIVE CONCENTRATION FOR MANY PATIENTS. HOWEVER, SOME ARE BEST TREATED AT CONCENTRATIONS OUTSIDE THIS RANGE. ACETAMINOPHEN CONCENTRATIONS >150 ug/mL AT 4 HOURS AFTER INGESTION AND >50 ug/mL AT 12 HOURS AFTER INGESTION ARE OFTEN ASSOCIATED WITH TOXIC REACTIONS.   Salicylate level     Status: None   Collection Time: 05/29/17  2:41 PM  Result Value Ref Range   Salicylate Lvl <4.7 2.8 - 30.0 mg/dL  Valproic acid level     Status: Abnormal   Collection Time: 05/29/17  2:41 PM  Result Value Ref Range   Valproic Acid  Lvl <10 (L) 50.0 - 100.0 ug/mL    Current Facility-Administered Medications  Medication Dose Route Frequency Provider Last Rate Last Dose  . OLANZapine zydis (ZYPREXA) disintegrating tablet 10 mg  10 mg Oral QHS Darel Hong, MD   10 mg at 05/30/17 0120   Current Outpatient Prescriptions  Medication Sig Dispense Refill  . amoxicillin (AMOXIL) 500 MG tablet Take 1 tablet (500 mg total) by mouth 3 (three) times daily. (Patient not taking: Reported on 05/29/2017) 30 tablet 0  . traMADol (ULTRAM) 50 MG tablet Take 1 tablet (50 mg total) by mouth every 6 (six) hours as needed. (Patient not taking: Reported on 05/29/2017) 12 tablet 0    Musculoskeletal: Strength & Muscle Tone: within normal limits Gait & Station: normal Patient leans: N/A  Psychiatric Specialty Exam: Physical Exam  Nursing note and vitals reviewed. Constitutional: He appears well-developed and well-nourished.  HENT:  Head: Normocephalic and atraumatic.  Eyes: Pupils are equal, round, and reactive to light. Conjunctivae are normal.  Neck: Normal range of motion.  Cardiovascular: Regular rhythm and normal heart sounds.   Respiratory: Effort normal. No respiratory distress.  GI: Soft.  Musculoskeletal: Normal range of motion.  Neurological: He is alert.  Skin: Skin is warm and dry.  Psychiatric: His affect is blunt and inappropriate. His speech is delayed. He is withdrawn. Thought content is paranoid and delusional. Cognition and memory are impaired. He expresses inappropriate judgment.    Review of Systems  Constitutional: Negative.   HENT: Negative.   Eyes: Negative.   Respiratory: Negative.   Cardiovascular: Negative.   Gastrointestinal: Negative.   Musculoskeletal: Negative.   Skin: Negative.   Neurological: Negative.   Psychiatric/Behavioral: Negative.     Blood pressure 103/61, pulse 77, temperature 98.8 F (37.1 C), temperature source Oral, resp. rate 16, height _0  (1.778 m), weight 81.6 kg (180 lb),  SpO2 96 %.Body mass index is 25.83 kg/m.  General Appearance: Fairly Groomed  Eye Contact:  Good  Speech:  Blocked  Volume:  Decreased  Mood:  Euthymic  Affect:  Blunt and Inappropriate  Thought Process:  Descriptions of Associations: Circumstantial  Orientation:  Full (Time, Place,  and Person)  Thought Content:  Illogical and Paranoid Ideation  Suicidal Thoughts:  No  Homicidal Thoughts:  Unknown  Memory:  Negative  Judgement:  Impaired  Insight:  Lacking  Psychomotor Activity:  Restlessness  Concentration:  Concentration: Poor  Recall:  Poor  Fund of Knowledge:  Good  Language:  Good  Akathisia:  Negative  Handed:  Right  AIMS (if indicated):     Assets:  Housing Physical Health  ADL's:  Intact  Cognition:  WNL  Sleep:        Treatment Plan Summary: Daily contact with patient to assess and evaluate symptoms and progress in treatment, Medication management and Plan Patient has taken medication and is not aggressive or dangerous. Remains paranoid and withdrawn. Still under commitment. Patient is appropriate for admission to psychiatric ward. Reviewed with TTS. Orders completed for admission down stairs. Patient informed of this but does not want to have any discussion with me about it.  Disposition: Recommend psychiatric Inpatient admission when medically cleared. Supportive therapy provided about ongoing stressors.  Alethia Berthold, MD 05/30/2017 4:09 PM

## 2017-05-30 NOTE — ED Notes (Signed)
Pt easily aroused from sleep when bringing breakfast tray to pt.  Pt reports having slept well, but feels somewhat 'groggy' then proceeds to lie back down.

## 2017-05-31 ENCOUNTER — Inpatient Hospital Stay
Admission: AD | Admit: 2017-05-31 | Discharge: 2017-06-11 | DRG: 885 | Disposition: A | Discharge status: Home / Self Care | Payer: PRIVATE HEALTH INSURANCE | Source: Intra-hospital | Attending: Psychiatry | Admitting: Psychiatry

## 2017-05-31 DIAGNOSIS — F17210 Nicotine dependence, cigarettes, uncomplicated: Secondary | ICD-10-CM | POA: Diagnosis present

## 2017-05-31 DIAGNOSIS — G4700 Insomnia, unspecified: Secondary | ICD-10-CM | POA: Diagnosis present

## 2017-05-31 DIAGNOSIS — F312 Bipolar disorder, current episode manic severe with psychotic features: Principal | ICD-10-CM | POA: Diagnosis present

## 2017-05-31 DIAGNOSIS — F319 Bipolar disorder, unspecified: Secondary | ICD-10-CM | POA: Diagnosis present

## 2017-05-31 DIAGNOSIS — Z818 Family history of other mental and behavioral disorders: Secondary | ICD-10-CM

## 2017-05-31 DIAGNOSIS — F17200 Nicotine dependence, unspecified, uncomplicated: Secondary | ICD-10-CM | POA: Diagnosis present

## 2017-05-31 MED ORDER — OLANZAPINE 5 MG PO TBDP
20.0000 mg | ORAL_TABLET | Freq: Every day | ORAL | Status: DC
  Filled 2017-05-31: qty 4

## 2017-05-31 MED ORDER — TRAZODONE HCL 100 MG PO TABS
100.0000 mg | ORAL_TABLET | Freq: Every evening | ORAL | Status: DC | PRN
  Administered 2017-06-02: 100 mg via ORAL
  Filled 2017-05-31 (×3): qty 1

## 2017-05-31 MED ORDER — HYDROXYZINE HCL 25 MG PO TABS
25.0000 mg | ORAL_TABLET | Freq: Three times a day (TID) | ORAL | Status: DC | PRN
  Administered 2017-06-02 – 2017-06-04 (×4): 25 mg via ORAL
  Filled 2017-05-31 (×5): qty 1

## 2017-05-31 MED ORDER — OLANZAPINE 5 MG PO TBDP: 10.0000 mg | ORAL_TABLET | Freq: Every day | ORAL | Status: DC

## 2017-05-31 MED ORDER — NICOTINE 21 MG/24HR TD PT24
21.0000 mg | MEDICATED_PATCH | Freq: Every day | TRANSDERMAL | Status: DC
  Administered 2017-06-02 – 2017-06-11 (×8): 21 mg via TRANSDERMAL
  Filled 2017-05-31 (×10): qty 1

## 2017-05-31 MED ORDER — MAGNESIUM HYDROXIDE 400 MG/5ML PO SUSP: 30.0000 mL | Freq: Every day | ORAL | Status: DC | PRN

## 2017-05-31 MED ORDER — ACETAMINOPHEN 325 MG PO TABS: 650.0000 mg | ORAL_TABLET | Freq: Four times a day (QID) | ORAL | Status: DC | PRN

## 2017-05-31 MED ORDER — ALUM & MAG HYDROXIDE-SIMETH 200-200-20 MG/5ML PO SUSP: 30.0000 mL | ORAL | Status: DC | PRN

## 2017-05-31 NOTE — Progress Notes (Signed)
Patient is guarded and forwards little.  Patient mood is irritable.  Patient engages in minimal contact with staff.  Security reports that patient thinks he is currently in jail.

## 2017-05-31 NOTE — Plan of Care (Signed)
Problem: Safety: Goal: Periods of time without injury will increase Outcome: Progressing Pt remains safe while in hospital injury free. He commits to safety on unit.

## 2017-05-31 NOTE — BH Assessment (Signed)
[facility] Toni Amendlapacs.  Attending Physician will be Dr. Jennet MaduroPucilowska.   [facility] Depoe BayPhyllis.   ER staff is aware of the admission Lewie Loron( Emilie, ER Sect.; Dr. Roxan Hockeyobinson,, ER MD; Toniann FailWendy, Pt RN; and Raina, Pt access)

## 2017-05-31 NOTE — [facility] Group Notes (Signed)
[facility] Group Notes:  (Nursing/MHT/Case Management/Adjunct)  Date:  05/31/2017  Time:  4:20 PM  Type of Therapy:  Psychoeducational Skills  Participation Level:  Did Not Attend  Twanna Hymanda C Chanler Mendonca 05/31/2017, 4:20 PM

## 2017-05-31 NOTE — ED Notes (Signed)
Patient irritable and anxious.

## 2017-05-31 NOTE — [facility] Group Notes (Signed)
[facility] LCSW Group Therapy Note  Date/Time: 05/31/17, 1300  Type of Therapy/Topic:  Group Therapy:  Balance in Life  Participation Level:  Did not attend  Description of Group:    This group will address the concept of balance and how it feels and looks when one is unbalanced. Patients will be encouraged to process areas in their lives that are out of balance, and identify reasons for remaining unbalanced. Facilitators will guide patients utilizing problem- solving interventions to address and correct the stressor making their life unbalanced. Understanding and applying boundaries will be explored and addressed for obtaining  and maintaining a balanced life. Patients will be encouraged to explore ways to assertively make their unbalanced needs known to significant others in their lives, using other group members and facilitator for support and feedback.  Therapeutic Goals: 1. Patient will identify two or more emotions or situations they have that consume much of in their lives. 2. Patient will identify signs/triggers that life has become out of balance:  3. Patient will identify two ways to set boundaries in order to achieve balance in their lives:  4. Patient will demonstrate ability to communicate their needs through discussion and/or role plays  Summary of Patient Progress:          Therapeutic Modalities:   Cognitive Behavioral Therapy Solution-Focused Therapy Assertiveness Training  Greg Jovanna Hodges, LCSW 

## 2017-05-31 NOTE — Tx Team (Signed)
Initial Treatment Plan 05/31/2017 11:08 AM Brandon Dodson UEA:[id number]    PATIENT STRESSORS: Other: paranoia    PATIENT STRENGTHS: Ability for insight   PATIENT IDENTIFIED PROBLEMS: Paranoia                      DISCHARGE CRITERIA:  Ability to meet basic life and health needs Improved stabilization in mood, thinking, and/or behavior  PRELIMINARY DISCHARGE PLAN: Outpatient therapy  PATIENT/FAMILY INVOLVEMENT: This treatment plan has been presented to and reviewed with the patient, Brandon Dodson, and/or family member, .  The patient and family have been given the opportunity to ask questions and make suggestions.  [name]  [name], RN 05/31/2017, 11:08 AM

## 2017-05-31 NOTE — H&P (Addendum)
Psychiatric Admission Assessment Adult  Patient Identification: Brandon Dodson MRN:  [id number] Date of Evaluation:  05/31/2017 Chief Complaint:  Bipolar Principal Diagnosis: Bipolar I disorder, current or most recent episode manic, with psychotic features [facility]) Diagnosis:   Patient Active Problem List   Diagnosis Date Noted  . Bipolar I disorder, current or most recent episode manic, with psychotic features ([place]) [F31.2] 05/31/2017  . Tobacco use disorder [F17.200] 05/31/2017   History of Present Illness:   Identifying data. Brandon Dodson is a 36 year old male with history of bipolar illness.  Chief complaint."I don't know."  History of present illness. Information was obtained from the patient and the chart. The patient was brought to the emergency room by police for bizarre behavior. The patient claimed that he was sprayed with some sticky chemical that is harmful to him. He was unable to tell us who has done it. He was shirtless in the ER. A family the patient has been behaving strangely for the past few days. He was unable to sleep. He is frightened his girlfriend accusing her of trying to kill him. The patient himself is not able or willing to provide much information. He is very pleasant and palyful but does not want to answer any questions. The only information on able to get out of him is that he doesn't need to worry about his bills because he is "rich". He accepted Zyprexa last night in the emergency room. He was negative for substances of on admission but is unable to give me a clear answer about substance use.  Past psychiatric history. According to his mother, he had a psychotic break few years ago and was hospitalized. The patient denies.  Family psychiatric history. Father with bipolar who was hospitalized frequently here.  Social history. The patient was unwilling to tell me about his living or work situation. We note that he has a mother and girlfriend. They were both  contacted by emergency room physician. At this point the patient does not give [place] permission to contact his family. His mother informed social worker that the patient has court date in [place] at the end of the month. He does have a Chief Executive Officer and might end up in jail.   Total Time spent with patient: 1 hour  Is the patient at risk to self? No.  Has the patient been a risk to self in the past 6 months? No.  Has the patient been a risk to self within the distant past? No.  Is the patient a risk to others? No.  Has the patient been a risk to others in the past 6 months? No.  Has the patient been a risk to others within the distant past? No.   Prior Inpatient Therapy:   Prior Outpatient Therapy:    Alcohol Screening: 1. How often do you have a drink containing alcohol?: Never 9. Have you or someone else been injured as a result of your drinking?: No 10. Has a relative or friend or a doctor or another health worker been concerned about your drinking or suggested you cut down?: No Alcohol Use Disorder Identification Test Final Score (AUDIT): 0 Brief Intervention: AUDIT score less than 7 or less-screening does not suggest unhealthy drinking-brief intervention not indicated Substance Abuse History in the last 12 months:  No. Consequences of Substance Abuse: NA Previous Psychotropic Medications: Yes  Psychological Evaluations: No  Past Medical History: History reviewed. No pertinent past medical history.  Past Surgical History:  Procedure Laterality Date  . DENTAL  SURGERY     Family History: History reviewed. No pertinent family history.  Tobacco Screening: Have you used any form of tobacco in the last 30 days? (Cigarettes, Smokeless Tobacco, Cigars, and/or Pipes): Yes Tobacco use, Select all that apply: 5 or more cigarettes per day Are you interested in Tobacco Cessation Medications?: No, patient refused Counseled patient on smoking cessation including recognizing danger situations,  developing coping skills and basic information about quitting provided: Refused/Declined practical counseling Social History:  History  Alcohol Use  . Yes     History  Drug use: Unknown    Additional Social History:                           Allergies:  No Known Allergies Lab Results:  Results for orders placed or performed during the hospital encounter of 05/29/17 (from the past 48 hour(s))  Comprehensive metabolic panel     Status: None   Collection Time: 05/29/17  2:41 PM  Result Value Ref Range   Sodium 138 135 - 145 mmol/L   Potassium 3.6 3.5 - 5.1 mmol/L   Chloride 103 101 - 111 mmol/L   CO2 24 22 - 32 mmol/L   Glucose, Bld 77 65 - 99 mg/dL   BUN 17 6 - 20 mg/dL   Creatinine, Ser 1.03 0.61 - 1.24 mg/dL   Calcium 9.4 8.9 - 10.3 mg/dL   Total Protein 8.1 6.5 - 8.1 g/dL   Albumin 4.8 3.5 - 5.0 g/dL   AST 28 15 - 41 U/L   ALT 21 17 - 63 U/L   Alkaline Phosphatase 67 38 - 126 U/L   Total Bilirubin 1.0 0.3 - 1.2 mg/dL   GFR calc non Af Amer >60 >60 mL/min   GFR calc Af Amer >60 >60 mL/min    Comment: (NOTE) The eGFR has been calculated using the CKD EPI equation. This calculation has not been validated in all clinical situations. eGFR's persistently <60 mL/min signify possible Chronic Kidney Disease.    Anion gap 11 5 - 15  Ethanol     Status: None   Collection Time: 05/29/17  2:41 PM  Result Value Ref Range   Alcohol, Ethyl (B) <5 <5 mg/dL    Comment:        LOWEST DETECTABLE LIMIT FOR SERUM ALCOHOL IS 5 mg/dL FOR MEDICAL PURPOSES ONLY   cbc     Status: Abnormal   Collection Time: 05/29/17  2:41 PM  Result Value Ref Range   WBC 15.3 (H) 3.8 - 10.6 K/uL   RBC 4.95 4.40 - 5.90 MIL/uL   Hemoglobin 14.7 13.0 - 18.0 g/dL   HCT 41.8 40.0 - 52.0 %   MCV 84.5 80.0 - 100.0 fL   MCH 29.7 26.0 - 34.0 pg   MCHC 35.1 32.0 - 36.0 g/dL   RDW 13.3 11.5 - 14.5 %   Platelets 233 150 - 440 K/uL  Acetaminophen level     Status: Abnormal   Collection Time:  05/29/17  2:41 PM  Result Value Ref Range   Acetaminophen (Tylenol), Serum <10 (L) 10 - 30 ug/mL    Comment:        THERAPEUTIC CONCENTRATIONS VARY SIGNIFICANTLY. A RANGE OF 10-30 ug/mL MAY BE AN EFFECTIVE CONCENTRATION FOR MANY PATIENTS. HOWEVER, SOME ARE BEST TREATED AT CONCENTRATIONS OUTSIDE THIS RANGE. ACETAMINOPHEN CONCENTRATIONS >150 ug/mL AT 4 HOURS AFTER INGESTION AND >50 ug/mL AT 12 HOURS AFTER INGESTION ARE OFTEN ASSOCIATED WITH TOXIC REACTIONS.  Salicylate level     Status: None   Collection Time: 05/29/17  2:41 PM  Result Value Ref Range   Salicylate Lvl <7.9 2.8 - 30.0 mg/dL  Valproic acid level     Status: Abnormal   Collection Time: 05/29/17  2:41 PM  Result Value Ref Range   Valproic Acid Lvl <10 (L) 50.0 - 100.0 ug/mL    Blood Alcohol level:  Lab Results  Component Value Date   ETH <5 39/11/90    Metabolic Disorder Labs:  No results found for: HGBA1C, MPG No results found for: PROLACTIN No results found for: CHOL, TRIG, HDL, CHOLHDL, VLDL, LDLCALC  Current Medications: Current Facility-Administered Medications  Medication Dose Route Frequency Provider Last Rate Last Dose  . acetaminophen (TYLENOL) tablet 650 mg  650 mg Oral Q6H PRN [name], [name], MD      . alum & mag hydroxide-simeth (MAALOX/MYLANTA) 200-200-20 MG/5ML suspension 30 mL  30 mL Oral Q4H PRN [name], [name], MD      . hydrOXYzine (ATARAX/VISTARIL) tablet 25 mg  25 mg Oral TID PRN [name], [name], MD      . magnesium hydroxide (MILK OF MAGNESIA) suspension 30 mL  30 mL Oral Daily PRN [name], [name], MD      . nicotine (NICODERM CQ - dosed in mg/24 hours) patch 21 mg  21 mg Transdermal Daily Dorothymae Maciver B, MD      . OLANZapine zydis (ZYPREXA) disintegrating tablet 20 mg  20 mg Oral QHS Skarlett Sedlacek B, MD      . traZODone (DESYREL) tablet 100 mg  100 mg Oral QHS PRN [name], Madie Reno, MD       PTA Medications: Prescriptions Prior to Admission  Medication Sig Dispense  Refill Last Dose  . amoxicillin (AMOXIL) 500 MG tablet Take 1 tablet (500 mg total) by mouth 3 (three) times daily. (Patient not taking: Reported on 05/29/2017) 30 tablet 0 Completed Course at Unknown time  . traMADol (ULTRAM) 50 MG tablet Take 1 tablet (50 mg total) by mouth every 6 (six) hours as needed. (Patient not taking: Reported on 05/29/2017) 12 tablet 0 Completed Course at Unknown time    Musculoskeletal: Strength & Muscle Tone: within normal limits Gait & Station: normal Patient leans: N/A  Psychiatric Specialty Exam: Physical Exam  Nursing note and vitals reviewed. Constitutional: He is oriented to person, place, and time. He appears well-developed and well-nourished.  HENT:  Head: Normocephalic and atraumatic.  Eyes: Pupils are equal, round, and reactive to light. Conjunctivae and EOM are normal.  Neck: Normal range of motion. Neck supple.  Cardiovascular: Normal rate, regular rhythm and normal heart sounds.   Respiratory: Effort normal and breath sounds normal.  GI: Soft. Bowel sounds are normal.  Musculoskeletal: Normal range of motion.  Neurological: He is alert and oriented to person, place, and time.  Skin: Skin is warm and dry.  Psychiatric: His affect is inappropriate. His speech is rapid and/or pressured. He is hyperactive. Thought content is paranoid and delusional. Cognition and memory are normal. He expresses impulsivity.    Review of Systems  Psychiatric/Behavioral: The patient has insomnia.   All other systems reviewed and are negative.   Blood pressure 128/68, pulse 60, temperature 98 F (36.7 C), temperature source Oral, resp. rate 18, height '5\' 10"'  (1.778 m), weight 78 kg (172 lb), SpO2 100 %.Body mass index is 24.68 kg/m.  See SRA.  Sleep:       Treatment Plan Summary: Daily contact with patient to assess and evaluate symptoms and progress in treatment and Medication management    Mr. Drudge is a 36 year old male with a history of bipolar illness admitted floridly manic and psychotic.  1. Mood and psychosis. He was started on Zyprexa in the emergency room. We'll increase the dose to 20 mg nightly.  2. Insomnia. Trazodone is available.  3. Anxiety. Vistaril is available.  4. Metabolic syndrome monitoring. Lipid panel, TSH, hemoglobin A1c are pending.  5. EKG. Pending.  6. Disposition. He will be discharged back to home. He will follow up with CBC in [facility].    Observation Level/Precautions:  15 minute checks  Laboratory:  CBC Chemistry Profile UDS UA  Psychotherapy:    Medications:    Consultations:    Discharge Concerns:    Estimated LOS:  Other:     Physician Treatment Plan for Primary Diagnosis: Bipolar I disorder, current or most recent episode manic, with psychotic features ([place]) Long Term Goal(s): Improvement in symptoms so as ready for discharge  Short Term Goals: Ability to identify changes in lifestyle to reduce recurrence of condition will improve, Ability to verbalize feelings will improve, Ability to disclose and discuss suicidal ideas, Ability to demonstrate self-control will improve, Ability to identify and develop effective coping behaviors will improve, Ability to maintain clinical measurements within normal limits will improve, Compliance with prescribed medications will improve and Ability to identify triggers associated with substance abuse/mental health issues will improve  Physician Treatment Plan for Secondary Diagnosis: Principal Problem:   Bipolar I disorder, current or most recent episode manic, with psychotic features ([place]) Active Problems:   Tobacco use disorder  Long Term Goal(s): NA  Short Term Goals: NA  I certify that inpatient services furnished can reasonably be expected to improve the patient's condition.    Orson Slick, MD 9/13/201812:57 PM

## 2017-05-31 NOTE — ED Notes (Signed)
Patient is alert and oriented x 4.  Denies suicidal thoughts, auditory and visual hallucinations.  Support and encouragement offered as needed.  Safety checks maintained every 15 minutes.  Patient transferred to BMU.

## 2017-05-31 NOTE — ED Provider Notes (Signed)
-----------------------------------------   7:18 AM on 05/31/2017 -----------------------------------------   Blood pressure 96/60, pulse 60, temperature (!) 97.5 F (36.4 C), temperature source Oral, resp. rate 20, height 5\' 10"  (1.778 m), weight 81.6 kg (180 lb), SpO2 99 %.  The patient had no acute events since last update.  Calm and cooperative at this time.  Disposition is pending Psychiatry/Behavioral Medicine team recommendations.     Irean HongSung, Mariaclara Spear J, MD 05/31/17 [phone]

## 2017-05-31 NOTE — Progress Notes (Signed)
Pt is a 36 y.o male admitted IVC to behavior medicine unit for bizarre/paranoid behaviors. He is calm and cooperative during admission process. Skin check and contraband check completed per protocol. Pt stated, "I just was involuntarily committed I don't know". Pt denies SI, HI, a/v hallucinations.He denies what is stated in IVC paperwork. Pt does not elaborate to RN about feelings. He seems to be very guarded at this time. Pt oriented to unit, rules, staff and activities. Will continue to monitor.

## 2017-05-31 NOTE — [facility] Group Notes (Signed)
[facility] Group Notes:  (Nursing/MHT/Case Management/Adjunct)  Date:  05/31/2017  Time:  9:37 PM  Type of Therapy:  Group Therapy  Participation Level:  Did Not Attend  Participation Quality:  Summary of Progress/Problems:  Brandon Dodson 05/31/2017, 9:37 PM

## 2017-05-31 NOTE — [facility] Suicide Risk Assessment (Signed)
[facility] Admission Suicide Risk Assessment   Nursing information obtained from:  Patient Demographic factors:    Current Mental Status:    Loss Factors:    Historical Factors:    Risk Reduction Factors:     Total Time spent with patient: 1 hour Principal Problem: Bipolar I disorder, current or most recent episode manic, with psychotic features [facility](HCC) Diagnosis:   Patient Active Problem List   Diagnosis Date Noted  . Bipolar I disorder, current or most recent episode manic, with psychotic features ([facility]) [F31.2] 05/31/2017  . Tobacco use disorder [F17.200] 05/31/2017   Subjective Data: psychotic break.  Continued Clinical Symptoms:  Alcohol Use Disorder Identification Test Final Score (AUDIT): 0 The "Alcohol Use Disorders Identification Test", Guidelines for Use in Primary Care, Second Edition.  World Science writerHealth Organization [facility](WHO). Score between 0-7:  no or low risk or alcohol related problems. Score between 8-15:  moderate risk of alcohol related problems. Score between 16-19:  high risk of alcohol related problems. Score 20 or above:  warrants further diagnostic evaluation for alcohol dependence and treatment.   CLINICAL FACTORS:   Bipolar Disorder:   Mixed State Currently Psychotic   Musculoskeletal: Strength & Muscle Tone: within normal limits Gait & Station: normal Patient leans: N/A  Psychiatric Specialty Exam: Physical Exam  Nursing note and vitals reviewed. Psychiatric: His affect is labile and inappropriate. His speech is rapid and/or pressured. He is hyperactive. Thought content is paranoid and delusional. Cognition and memory are normal. He expresses impulsivity.    Review of Systems  Psychiatric/Behavioral: The patient has insomnia.   All other systems reviewed and are negative.   Blood pressure 128/68, pulse 60, temperature 98 F (36.7 C), temperature source Oral, resp. rate 18, height 5\' 10"  (1.778 m), weight 78 kg (172 lb), SpO2 100 %.Body mass index is 24.68  kg/m.  General Appearance: Casual  Eye Contact:  Fair  Speech:  Pressured  Volume:  Normal  Mood:  Dysphoric and Irritable  Affect:  Inappropriate  Thought Process:  Goal Directed and Descriptions of Associations: Intact  Orientation:  Full (Time, Place, and Person)  Thought Content:  Delusions and Paranoid Ideation  Suicidal Thoughts:  No  Homicidal Thoughts:  No  Memory:  Immediate;   Fair Recent;   Fair Remote;   Fair  Judgement:  Poor  Insight:  Lacking  Psychomotor Activity:  Increased  Concentration:  Concentration: Fair and Attention Span: Fair  Recall:  [employer]  Fund of Knowledge:  Fair  Language:  Fair  Akathisia:  No  Handed:  Right  AIMS (if indicated):     Assets:  Communication Skills Desire for Improvement Financial Resources/Insurance Housing Physical Health Resilience Social Support  ADL's:  Intact  Cognition:  WNL  Sleep:         COGNITIVE FEATURES THAT CONTRIBUTE TO RISK:  None    SUICIDE RISK:   Moderate:  Frequent suicidal ideation with limited intensity, and duration, some specificity in terms of plans, no associated intent, good self-control, limited dysphoria/symptomatology, some risk factors present, and identifiable protective factors, including available and accessible social support.  PLAN OF CARE: Hospital admission, medication management, discharge planning.  Brandon Dodson is a 36 year old male with a history of bipolar illness admitted floridly manic and psychotic.  1. Mood and psychosis. He was started on Zyprexa in the emergency room. We'll increase the dose to 20 mg nightly.  2. Insomnia. Trazodone is available.  3. Anxiety. Vistaril is available.  4. Metabolic syndrome monitoring. Lipid panel,  TSH, hemoglobin A1c are pending.  5. EKG. Pending.  6. Disposition. He will be discharged back to home. He will follow up with [facility] in [facility].   I certify that inpatient services furnished can reasonably be expected to improve the  patient's condition.   Kristine Linea, MD 05/31/2017, 12:44 PM

## 2017-05-31 NOTE — ED Notes (Signed)
Patient transferred to BMU.  All belonging given to patient.  No behavioral issues noted or reported.  Verbalizes understanding of transfer.

## 2017-06-01 LAB — HEMOGLOBIN A1C
Hgb A1c MFr Bld: 4.8 % (ref 4.8–5.6)
Mean Plasma Glucose: 91.06 mg/dL

## 2017-06-01 LAB — TSH: TSH: 1.287 u[IU]/mL (ref 0.350–4.500)

## 2017-06-01 LAB — LIPID PANEL
CHOL/HDL RATIO: 4.4 ratio
Cholesterol: 142 mg/dL (ref 0–200)
HDL: 32 mg/dL — ABNORMAL LOW (ref >40)
LDL CALC: 93 mg/dL (ref 0–99)
Triglycerides: 87 mg/dL (ref <150)
VLDL: 17 mg/dL (ref 0–40)

## 2017-06-01 MED ORDER — OLANZAPINE 5 MG PO TBDP
20.0000 mg | ORAL_TABLET | Freq: Every day | ORAL | Status: DC
  Administered 2017-06-01: 20 mg via ORAL
  Filled 2017-06-01 (×2): qty 4

## 2017-06-01 MED ORDER — OLANZAPINE 10 MG IM SOLR: 10.0000 mg | Freq: Every day | INTRAMUSCULAR | Status: DC

## 2017-06-01 MED ORDER — OLANZAPINE 5 MG PO TBDP
20.0000 mg | ORAL_TABLET | Freq: Every day | ORAL | Status: AC
  Administered 2017-06-01: 20 mg via ORAL
  Filled 2017-06-01 (×2): qty 4

## 2017-06-01 NOTE — Progress Notes (Signed)
Patient refused medication 

## 2017-06-01 NOTE — Progress Notes (Signed)
Patient remains guarded on approach and had minimal interaction with peers. Behavior remains bizarre and when writer asked him about coming to take his medicine he stated he already taken his medication but he had not taken her medicine at all.

## 2017-06-01 NOTE — Progress Notes (Signed)
Patient is guarded and forwards little information.  Isolative to room majority of the day.  No group attendance.  Around 1500 patient up to perform ADL's.  Requested comb and hygiene products.  Up for meals.  Noted talking on the phone.  Refused to participate in formal treatment team meeting.  Dr Jennet Maduro, SW and this writer went to patients room for treatment team meeting.  Patient agreeable to talk.  Verbalized that he was ready to go home.  Dr. Jennet Maduro in formed him of what needed to be done in order to go home.  Patient states  "I am not doing that", patient then lay down and covered his head.  Support offered.  Safety checks maintained.

## 2017-06-01 NOTE — [facility] Counselor (Signed)
LCSW attempted to complete PSA with patient and he refused. Patient laying bed, reports he wants to sleep and does not want to talk. Assessed if patient needed anything and he declined.  Per RN patient remains irritable and isolating.  Affect is bizarre.  LCSW will attempt to engage patient at a later time today to complete PSA if able. If unable, may defer to weekend CSW to complete if able.  Deretha Emory, MSW Clinical Social Work: Optician, dispensing

## 2017-06-01 NOTE — [facility] Group Notes (Signed)
[facility] Group Notes:  (Nursing/MHT/Case Management/Adjunct)  Date:  06/01/2017  Time:  10:00 AM  Type of Therapy:  Psychoeducational Skills  Participation Level:  Did Not Attend  Twanna Hy 06/01/2017, 10:00 AM

## 2017-06-01 NOTE — Tx Team (Signed)
Interdisciplinary Treatment and Diagnostic Plan Update  06/01/2017 Time of Session: Brandon Dodson MRN: [id number]  Principal Diagnosis: Bipolar I disorder, current or most recent episode manic, with psychotic features ([place])  Secondary Diagnoses: Principal Problem:   Bipolar I disorder, current or most recent episode manic, with psychotic features ([place]) Active Problems:   Tobacco use disorder   Current Medications:  Current Facility-Administered Medications  Medication Dose Route Frequency Provider Last Rate Last Dose  . acetaminophen (TYLENOL) tablet 650 mg  650 mg Oral Q6H PRN [name], [name], MD      . alum & mag hydroxide-simeth (MAALOX/MYLANTA) 200-200-20 MG/5ML suspension 30 mL  30 mL Oral Q4H PRN [name], [name], MD      . hydrOXYzine (ATARAX/VISTARIL) tablet 25 mg  25 mg Oral TID PRN [name], [name], MD      . magnesium hydroxide (MILK OF MAGNESIA) suspension 30 mL  30 mL Oral Daily PRN [name], [name], MD      . nicotine (NICODERM CQ - dosed in mg/24 hours) patch 21 mg  21 mg Transdermal Daily [name], [name], MD      . OLANZapine zydis (ZYPREXA) disintegrating tablet 20 mg  20 mg Oral QHS [name], [name], MD      . traZODone (DESYREL) tablet 100 mg  100 mg Oral QHS PRN [name], Madie Reno, MD       PTA Medications: Prescriptions Prior to Admission  Medication Sig Dispense Refill Last Dose  . amoxicillin (AMOXIL) 500 MG tablet Take 1 tablet (500 mg total) by mouth 3 (three) times daily. (Patient not taking: Reported on 05/29/2017) 30 tablet 0 Completed Course at Unknown time  . traMADol (ULTRAM) 50 MG tablet Take 1 tablet (50 mg total) by mouth every 6 (six) hours as needed. (Patient not taking: Reported on 05/29/2017) 12 tablet 0 Completed Course at Unknown time    Patient Stressors: Other: paranoia   Patient Strengths: Ability for insight  Treatment Modalities: Medication Management, Group therapy, Case management,  1 to 1 session with clinician,  Psychoeducation, Recreational therapy.   Physician Treatment Plan for Primary Diagnosis: Bipolar I disorder, current or most recent episode manic, with psychotic features ([place]) Long Term Goal(s): Improvement in symptoms so as ready for discharge NA   Short Term Goals: Ability to identify changes in lifestyle to reduce recurrence of condition will improve Ability to verbalize feelings will improve Ability to disclose and discuss suicidal ideas Ability to demonstrate self-control will improve Ability to identify and develop effective coping behaviors will improve Ability to maintain clinical measurements within normal limits will improve Compliance with prescribed medications will improve Ability to identify triggers associated with substance abuse/mental health issues will improve NA  Medication Management: Evaluate patient's response, side effects, and tolerance of medication regimen.  Therapeutic Interventions: 1 to 1 sessions, Unit Group sessions and Medication administration.  Evaluation of Outcomes: Not Met  Physician Treatment Plan for Secondary Diagnosis: Principal Problem:   Bipolar I disorder, current or most recent episode manic, with psychotic features ([place]) Active Problems:   Tobacco use disorder  Long Term Goal(s): Improvement in symptoms so as ready for discharge NA   Short Term Goals: Ability to identify changes in lifestyle to reduce recurrence of condition will improve Ability to verbalize feelings will improve Ability to disclose and discuss suicidal ideas Ability to demonstrate self-control will improve Ability to identify and develop effective coping behaviors will improve Ability to maintain clinical measurements within normal limits will improve Compliance with  prescribed medications will improve Ability to identify triggers associated with substance abuse/mental health issues will improve NA     Medication Management: Evaluate patient's response, side  effects, and tolerance of medication regimen.  Therapeutic Interventions: 1 to 1 sessions, Unit Group sessions and Medication administration.  Evaluation of Outcomes: Not Met   RN Treatment Plan for Primary Diagnosis: Bipolar I disorder, current or most recent episode manic, with psychotic features ([place]) Long Term Goal(s): Knowledge of disease and therapeutic regimen to maintain health will improve  Short Term Goals: Ability to identify and develop effective coping behaviors will improve and Compliance with prescribed medications will improve  Medication Management: RN will administer medications as ordered by provider, will assess and evaluate patient's response and provide education to patient for prescribed medication. RN will report any adverse and/or side effects to prescribing provider.  Therapeutic Interventions: 1 on 1 counseling sessions, Psychoeducation, Medication administration, Evaluate responses to treatment, Monitor vital signs and CBGs as ordered, Perform/monitor CIWA, COWS, AIMS and Fall Risk screenings as ordered, Perform wound care treatments as ordered.  Evaluation of Outcomes: Not Met   LCSW Treatment Plan for Primary Diagnosis: Bipolar I disorder, current or most recent episode manic, with psychotic features ([place]) Long Term Goal(s): Safe transition to appropriate next level of care at discharge, Engage patient in therapeutic group addressing interpersonal concerns.  Short Term Goals: Engage patient in aftercare planning with referrals and resources and Increase skills for wellness and recovery  Therapeutic Interventions: Assess for all discharge needs, 1 to 1 time with Social worker, Explore available resources and support systems, Assess for adequacy in community support network, Educate family and significant other(s) on suicide prevention, Complete Psychosocial Assessment, Interpersonal group therapy.  Evaluation of Outcomes: Not Met   Progress in  Treatment: Attending groups: No. Participating in groups: No. Taking medication as prescribed: No. Toleration medication: No. Family/Significant other contact made: No, will contact:  when given permission Patient understands diagnosis: No. Discussing patient identified problems/goals with staff: No. Medical problems stabilized or resolved: Yes. Denies suicidal/homicidal ideation: Yes. Issues/concerns per patient self-inventory: No. Other: none  New problem(s) identified: No, Describe:  none  New Short Term/Long Term Goal(s):  Discharge Plan or Barriers:   Reason for Continuation of Hospitalization: Delusions  Hallucinations  Estimated Length of Stay:5-7 days.  Attendees: Patient: Brandon Dodson 06/01/2017   Physician: Dr. Bary Leriche, MD 06/01/2017   Nursing: Elige Radon, RN 06/01/2017   RN Care Manager: 06/01/2017   Social Worker: Lurline Idol, LCSW 06/01/2017   Recreational Therapist:  06/01/2017   Other:  06/01/2017   Other:  06/01/2017   Other: 06/01/2017        Scribe for Treatment Team: Joanne Chars, [place] 06/01/2017 2:50 PM

## 2017-06-01 NOTE — Progress Notes (Signed)
[facility] MD Progress Note  06/01/2017 12:48 PM Brandon Dodson  MRN:  [id number] Subjective:   Brandon Dodson is a 36 year old male with likely diagnosis of bipolar disorder admitted for bizarre behavior and paranoia. He was started on Zyprexa in the emergency room but did refuse medications last night. If he refuses again tonight we will have to start forced medications. I will write orders but I will need second opinion from Dr. Joseph Art.  06/01/2017. Brandon Dodson refused to meet with treatment team in our regular setting. He did talk to [place] in his room but refused to sign the form of knowledge is that we have anything and he was able to ask questions. He did not recognize me from yesterday. He refused medications last night. He denies any symptoms of depression, anxiety, or psychosis. He is in his bed, shirtless as at the time of admission unwilling to provide any information. He is quite upset with [place] and tells [place] that he will not pay for this hospitalization. His mother called to let us know that he has a sentencing court date at the end of September in another state. I did not have a chance to talk to her and we do not know if he will be going to jail. In my opinion, the patient needs forced medication orders. In our hospital we offer forced medication after 2 refusals. If he refuses tonight, we will start forced medications tomorrow, if Dr. Joseph Art agrees.  Per nursing:  Patient is guarded and forwards little.  Patient mood is irritable.  Patient engages in minimal contact with staff.  Security reports that patient thinks he is currently in jail.     Principal Problem: Bipolar I disorder, current or most recent episode manic, with psychotic features ([facility]) Diagnosis:   Patient Active Problem List   Diagnosis Date Noted  . Bipolar I disorder, current or most recent episode manic, with psychotic features ([facility]) [F31.2] 05/31/2017  . Tobacco use disorder [F17.200] 05/31/2017   Total Time spent with patient: 30  minutes  Past Psychiatric History: bipolar illness.  Past Medical History: History reviewed. No pertinent past medical history.  Past Surgical History:  Procedure Laterality Date  . DENTAL SURGERY     Family History: History reviewed. No pertinent family history. Family Psychiatric  History: unknown. Social History:  History  Alcohol Use  . Yes     History  Drug use: Unknown    Social History   Social History  . Marital status: Single    Spouse name: N/A  . Number of children: N/A  . Years of education: N/A   Social History Main Topics  . Smoking status: Current Every Day Smoker    Packs/day: 1.00    Types: Cigarettes  . Smokeless tobacco: Never Used  . Alcohol use Yes  . Drug use: Unknown  . Sexual activity: Not Asked   Other Topics Concern  . None   Social History Narrative  . None   Additional Social History:                         Sleep: Fair  Appetite:  Fair  Current Medications: Current Facility-Administered Medications  Medication Dose Route Frequency Provider Last Rate Last Dose  . acetaminophen (TYLENOL) tablet 650 mg  650 mg Oral Q6H PRN [name], [name], MD      . alum & mag hydroxide-simeth (MAALOX/MYLANTA) 200-200-20 MG/5ML suspension 30 mL  30 mL Oral Q4H PRN [name], John  T, MD      . hydrOXYzine (ATARAX/VISTARIL) tablet 25 mg  25 mg Oral TID PRN [name], [name], MD      . magnesium hydroxide (MILK OF MAGNESIA) suspension 30 mL  30 mL Oral Daily PRN [name], [name], MD      . nicotine (NICODERM CQ - dosed in mg/24 hours) patch 21 mg  21 mg Transdermal Daily Mynor Witkop B, MD      . OLANZapine zydis (ZYPREXA) disintegrating tablet 20 mg  20 mg Oral QHS Calie Buttrey B, MD      . traZODone (DESYREL) tablet 100 mg  100 mg Oral QHS PRN [name], [name], MD        Lab Results:  Results for orders placed or performed during the hospital encounter of 05/31/17 (from the past 48 hour(s))  Hemoglobin A1c     Status: None    Collection Time: 06/01/17  7:17 AM  Result Value Ref Range   Hgb A1c MFr Bld 4.8 4.8 - 5.6 %    Comment: (NOTE) Pre diabetes:          5.7%-6.4% Diabetes:              >6.4% Glycemic control for   <7.0% adults with diabetes    Mean Plasma Glucose 91.06 mg/dL    Comment: Performed at [facility], [address] [place], [place] [address]  Lipid panel     Status: Abnormal   Collection Time: 06/01/17  7:17 AM  Result Value Ref Range   Cholesterol 142 0 - 200 mg/dL   Triglycerides 87 <478 mg/dL   HDL 32 (L) >29 mg/dL   Total CHOL/HDL Ratio 4.4 RATIO   VLDL 17 0 - 40 mg/dL   LDL Cholesterol 93 0 - 99 mg/dL    Comment:        Total Cholesterol/HDL:CHD Risk Coronary Heart Disease Risk Table                     Men   Women  1/2 Average Risk   3.4   3.3  Average Risk       5.0   4.4  2 X Average Risk   9.6   7.1  3 X Average Risk  23.4   11.0        Use the calculated Patient Ratio above and the CHD Risk Table to determine the patient's CHD Risk.        ATP III CLASSIFICATION (LDL):  <100     mg/dL   Optimal  562-130  mg/dL   Near or Above                    Optimal  130-159  mg/dL   Borderline  865-784  mg/dL   High  >696     mg/dL   Very High   TSH     Status: None   Collection Time: 06/01/17  7:17 AM  Result Value Ref Range   TSH 1.287 0.350 - 4.500 uIU/mL    Comment: Performed by a 3rd Generation assay with a functional sensitivity of <=0.01 uIU/mL.    Blood Alcohol level:  Lab Results  Component Value Date   ETH <5 05/29/2017    Metabolic Disorder Labs: Lab Results  Component Value Date   HGBA1C 4.8 06/01/2017   MPG 91.06 06/01/2017   No results found for: PROLACTIN Lab Results  Component Value Date   CHOL 142  06/01/2017   TRIG 87 06/01/2017   HDL 32 (L) 06/01/2017   CHOLHDL 4.4 06/01/2017   VLDL 17 06/01/2017   LDLCALC 93 06/01/2017    Physical Findings: AIMS: Facial and Oral Movements Muscles of Facial Expression: None, normal Lips and  Perioral Area: None, normal Jaw: None, normal Tongue: None, normal,Extremity Movements Upper (arms, wrists, hands, fingers): None, normal Lower (legs, knees, ankles, toes): None, normal, Trunk Movements Neck, shoulders, hips: None, normal, Overall Severity Severity of abnormal movements (highest score from questions above): None, normal Incapacitation due to abnormal movements: None, normal Patient's awareness of abnormal movements (rate only patient's report): No Awareness, Dental Status Current problems with teeth and/or dentures?: No Does patient usually wear dentures?: No  CIWA:  CIWA-Ar Total: 0 COWS:  COWS Total Score: 0  Musculoskeletal: Strength & Muscle Tone: within normal limits Gait & Station: normal Patient leans: N/A  Psychiatric Specialty Exam: Physical Exam  Nursing note and vitals reviewed. Psychiatric: His speech is normal. His affect is labile. He is slowed and withdrawn. Thought content is paranoid and delusional. Cognition and memory are normal. He expresses impulsivity.    Review of Systems  Psychiatric/Behavioral: The patient has insomnia.   All other systems reviewed and are negative.   Blood pressure 103/73, pulse 67, temperature 97.9 F (36.6 C), temperature source Oral, resp. rate 18, height  (1.778 m), weight 78 kg (172 lb), SpO2 98 %.Body mass index is 24.68 kg/m.  General Appearance: Casual  Eye Contact:  Minimal  Speech:  Pressured  Volume:  Normal  Mood:  Angry, Dysphoric and Irritable  Affect:   Thought Process:  Disorganized and Descriptions of Associations: Loose  Orientation:  Full (Time, Place, and Person)  Thought Content:  Delusions and Paranoid Ideation  Suicidal Thoughts:  No  Homicidal Thoughts:  No  Memory:  Immediate;   Poor Recent;   Poor Remote;   Poor  Judgement:  Poor  Insight:  Lacking  Psychomotor Activity:  Decreased  Concentration:  Concentration: Poor and Attention Span: Poor  Recall:  Poor  Fund of Knowledge:   Fair  Language:  Fair  Akathisia:  No  Handed:  Right  AIMS (if indicated):     Assets:  Architect Housing Physical Health Resilience Social Support  ADL's:  Intact  Cognition:  WNL  Sleep:        Treatment Plan Summary: Daily contact with patient to assess and evaluate symptoms and progress in treatment and Medication management   Brandon Dodson is a 36 year old male with a history of bipolar illness admitted floridly manic and psychotic.  1. Mood and psychosis. He was started on Zyprexa in the emergency room but refused last night. I will put forced medications order if Dr. Joseph Art supports it.   2. Substance abuse. UDS was not completed on admission. We have no information about substance use.   3. Insomnia. Trazodone is available.  4. Anxiety. Vistaril is available.  5. Metabolic syndrome monitoring. Lipid panel, TSH, hemoglobin A1c are normal.   6. EKG. Pending.  7. Disposition. He will be discharged back to home. He will follow up with [facility] in [facility].   Kristine Linea, MD 06/01/2017, 12:48 PM

## 2017-06-01 NOTE — Progress Notes (Signed)
Pt walked up to RN and asked for medication. Pt received zyprexa zydis tabs. Calm and cooperative at this time.

## 2017-06-01 NOTE — Plan of Care (Signed)
Problem: Coping: Goal: Ability to verbalize frustrations and anger appropriately will improve Outcome: Not Progressing Isolates to room.  Guarded.  Forwards little information. Refused to come to treatment team.

## 2017-06-01 NOTE — [facility] Group Notes (Signed)
LCSW Group Therapy Note   06/01/2017 2:45pm  Type of Therapy and Topic:  Group Therapy:   Emotions and Triggers    Participation Level:  Did Not Attend    Brandon Dodson 06/01/2017 2:14 PM

## 2017-06-02 MED ORDER — TRAZODONE HCL 50 MG PO TABS
50.0000 mg | ORAL_TABLET | Freq: Once | ORAL | Status: AC
  Administered 2017-06-02: 50 mg via ORAL

## 2017-06-02 MED ORDER — OLANZAPINE 5 MG PO TBDP
20.0000 mg | ORAL_TABLET | Freq: Every day | ORAL | Status: DC
  Administered 2017-06-02 – 2017-06-04 (×3): 20 mg via ORAL
  Filled 2017-06-02 (×2): qty 4

## 2017-06-02 MED ORDER — OLANZAPINE 10 MG IM SOLR: 10.0000 mg | Freq: Every day | INTRAMUSCULAR | Status: DC

## 2017-06-02 MED ORDER — HYDROXYZINE HCL 25 MG PO TABS
25.0000 mg | ORAL_TABLET | Freq: Once | ORAL | Status: AC
  Administered 2017-06-02: 25 mg via ORAL

## 2017-06-02 NOTE — [facility] Counselor (Signed)
Second attempt made to complete PSA.  Pt refused to get up, CSW asked if we could complete PSA in pt room and pt agreed, however, when CSW began asking questions, pt stated "I"m not doing this right now" and again became uncooperative, refusing to participate. Garner Nash, MSW, LCSW Clinical Social Worker 06/02/2017 10:41 AM

## 2017-06-02 NOTE — Plan of Care (Signed)
Problem: Coping: Goal: Ability to demonstrate self-control will improve Outcome: Progressing No angry outburst.  Cordial.

## 2017-06-02 NOTE — [facility] Group Notes (Signed)
[facility] Group Notes:  (Nursing/MHT/Case Management/Adjunct)  Date:  06/02/2017  Time:  5:08 AM  Type of Therapy:  Psychoeducational Skills  Participation Level:  Minimal  Participation Quality:  Inattentive  Affect:  Appropriate  Cognitive:  Appropriate  Insight:  Limited  Engagement in Group:  Limited  Modes of Intervention:  Discussion, Socialization and Support  Summary of Progress/Problems:  Brandon Dodson 06/02/2017, 5:08 AM

## 2017-06-02 NOTE — Progress Notes (Addendum)
[facility] MD Progress Note  06/02/2017 2:48 PM Brandon Dodson  MRN:  [id number] Subjective:   Brandon Dodson is a 36 year old male with likely diagnosis of bipolar disorder admitted for bizarre behavior and paranoia. He was started on Zyprexa in the emergency room but did refuse medications last night. If he refuses again tonight we will have to start forced medications. I will write orders but I will need second opinion from Brandon Dodson.  06/01/2017. Brandon Dodson refused to meet with treatment team in our regular setting. He did talk to [place] in his room but refused to sign the form of knowledge is that we have anything and he was able to ask questions. He did not recognize me from yesterday. He refused medications last night. He denies any symptoms of depression, anxiety, or psychosis. He is in his bed, shirtless as at the time of admission unwilling to provide any information. He is quite upset with [place] and tells [place] that he will not pay for this hospitalization. His mother called to let us know that he has a sentencing court date at the end of September in another state. I did not have a chance to talk to her and we do not know if he will be going to jail. In my opinion, the patient needs forced medication orders. In our hospital we offer forced medication after 2 refusals. If he refuses tonight, we will start forced medications tomorrow, if Brandon Dodson agrees.   9/15- Chart reviewed, discussed with nursing staff, pt seen. Pt isolative, withdrawn, paranoid, reluctant to take medicine.  Pt in his room, very guarded, , poor insight, asked me to " leave the room real quick", stases I'm fine, I don'Dodson need to talk to you". Poor insight and judgement, pt is putting self and others at risk by refusing po psychotropic meds.  I agree with with Brandon Dodson for forced im psychotropic med  if he continues to refuse po psych meds.    Per nursing:  Patient remains guarded on approach and had minimal interaction with peers.  Behavior remains bizarre and when writer asked him about coming to take his medicine he stated he already taken his medication but he had not taken her medicine at all.    Principal Problem: Bipolar I disorder, current or most recent episode manic, with psychotic features (HCC) Diagnosis:   Patient Active Problem List   Diagnosis Date Noted  . Bipolar I disorder, current or most recent episode manic, with psychotic features (HCC) [F31.2] 05/31/2017  . Tobacco use disorder [F17.200] 05/31/2017   Total Time spent with patient: 30 minutes  Past Psychiatric History: bipolar illness.  Past Medical History: History reviewed. No pertinent past medical history.  Past Surgical History:  Procedure Laterality Date  . DENTAL SURGERY     Family History: History reviewed. No pertinent family history. Family Psychiatric  History: unknown. Social History:  History  Alcohol Use  . Yes     History  Drug use: Unknown    Social History   Social History  . Marital status: Single    Spouse name: N/A  . Number of children: N/A  . Years of education: N/A   Social History Main Topics  . Smoking status: Current Every Day Smoker    Packs/day: 1.00    Types: Cigarettes  . Smokeless tobacco: Never Used  . Alcohol use Yes  . Drug use: Unknown  . Sexual activity: Not Asked   Other Topics Concern  . None  Social History Narrative  . None   Additional Social History:                         Sleep: Fair  Appetite:  Fair  Current Medications: Current Facility-Administered Medications  Medication Dose Route Frequency Provider Last Rate Last Dose  . acetaminophen (TYLENOL) tablet 650 mg  650 mg Oral Q6H PRN [name], Brandon T, MD      . alum & mag hydroxide-simeth (MAALOX/MYLANTA) 200-200-20 MG/5ML suspension 30 mL  30 mL Oral Q4H PRN [name], Brandon T, MD      . hydrOXYzine (ATARAX/VISTARIL) tablet 25 mg  25 mg Oral TID PRN [name], Brandon Denmark, MD   25 mg at 06/02/17 0030  . magnesium  hydroxide (MILK OF MAGNESIA) suspension 30 mL  30 mL Oral Daily PRN [name], Brandon T, MD      . nicotine (NICODERM CQ - dosed in mg/24 hours) patch 21 mg  21 mg Transdermal Daily Brandon Dodson, Brandon B, MD      . OLANZapine (ZYPREXA) injection 10 mg  10 mg Intramuscular Daily Brandon Dodson, Brandon B, MD      . OLANZapine zydis (ZYPREXA) disintegrating tablet 20 mg  20 mg Oral Daily Brandon Dodson, Brandon B, MD   20 mg at 06/01/17 1641  . traZODone (DESYREL) tablet 100 mg  100 mg Oral QHS PRN [name], Brandon Denmark, MD   100 mg at 06/02/17 0031    Lab Results:  Results for orders placed or performed during the hospital encounter of 05/31/17 (from the past 48 hour(s))  Hemoglobin A1c     Status: None   Collection Time: 06/01/17  7:17 AM  Result Value Ref Range   Hgb A1c MFr Bld 4.8 4.8 - 5.6 %    Comment: (NOTE) Pre diabetes:          5.7%-6.4% Diabetes:              >6.4% Glycemic control for   <7.0% adults with diabetes    Mean Plasma Glucose 91.06 mg/dL    Comment: Performed at [facility], [address] [place], [place] [address]  Lipid panel     Status: Abnormal   Collection Time: 06/01/17  7:17 AM  Result Value Ref Range   Cholesterol 142 0 - 200 mg/dL   Triglycerides 87 <295 mg/dL   HDL 32 (L) >28 mg/dL   Total CHOL/HDL Ratio 4.4 RATIO   VLDL 17 0 - 40 mg/dL   LDL Cholesterol 93 0 - 99 mg/dL    Comment:        Total Cholesterol/HDL:CHD Risk Coronary Heart Disease Risk Table                     Men   Women  1/2 Average Risk   3.4   3.3  Average Risk       5.0   4.4  2 X Average Risk   9.6   7.1  3 X Average Risk  23.4   11.0        Use the calculated Patient Ratio above and the CHD Risk Table to determine the patient's CHD Risk.        ATP III CLASSIFICATION (LDL):  <100     mg/dL   Optimal  413-244  mg/dL   Near or Above                    Optimal  130-159  mg/dL   Borderline  409-811  mg/dL   High  >914     mg/dL   Very High   TSH     Status: None   Collection  Time: 06/01/17  7:17 AM  Result Value Ref Range   TSH 1.287 0.350 - 4.500 uIU/mL    Comment: Performed by a 3rd Generation assay with a functional sensitivity of <=0.01 uIU/mL.    Blood Alcohol level:  Lab Results  Component Value Date   ETH <5 05/29/2017    Metabolic Disorder Labs: Lab Results  Component Value Date   HGBA1C 4.8 06/01/2017   MPG 91.06 06/01/2017   No results found for: PROLACTIN Lab Results  Component Value Date   CHOL 142 06/01/2017   TRIG 87 06/01/2017   HDL 32 (L) 06/01/2017   CHOLHDL 4.4 06/01/2017   VLDL 17 06/01/2017   LDLCALC 93 06/01/2017    Physical Findings: AIMS: Facial and Oral Movements Muscles of Facial Expression: None, normal Lips and Perioral Area: None, normal Jaw: None, normal Tongue: None, normal,Extremity Movements Upper (arms, wrists, hands, fingers): None, normal Lower (legs, knees, ankles, toes): None, normal, Trunk Movements Neck, shoulders, hips: None, normal, Overall Severity Severity of abnormal movements (highest score from questions above): None, normal Incapacitation due to abnormal movements: None, normal Patient's awareness of abnormal movements (rate only patient's report): No Awareness, Dental Status Current problems with teeth and/or dentures?: No Does patient usually wear dentures?: No  CIWA:  CIWA-Ar Total: 0 COWS:  COWS Total Score: 0  Musculoskeletal: Strength & Muscle Tone: within normal limits Gait & Station: normal Patient leans: N/A  Psychiatric Specialty Exam: Physical Exam  Nursing note and vitals reviewed. Psychiatric: His speech is normal. His affect is labile. He is slowed and withdrawn. Thought content is paranoid and delusional. Cognition and memory are normal. He expresses impulsivity.    Review of Systems  Psychiatric/Behavioral: The patient has insomnia.   All other systems reviewed and are negative.   Blood pressure 103/73, pulse 67, temperature 97.9 F (36.6 C), temperature source  Oral, resp. rate 18, height  (1.778 m), weight 78 kg (172 lb), SpO2 98 %.Body mass index is 24.68 kg/m.  General Appearance: Casual, guarded  Eye Contact:  Minimal  Speech:  Pressured  Volume:  Normal  Mood:  Angry, Dysphoric and Irritable  Affect:   Thought Process:  Disorganized and Descriptions of Associations: Loose  Orientation:  Full (Time, Place, and Person)  Thought Content:  Delusions and Paranoid Ideation  Suicidal Thoughts:  No  Homicidal Thoughts:  No  Memory:  Immediate;   Poor Recent;   Poor Remote;   Poor  Judgement:  Poor  Insight:  Lacking  Psychomotor Activity:  Decreased  Concentration:  Concentration: Poor and Attention Span: Poor  Recall:  Poor  Fund of Knowledge:  Fair  Language:  Fair  Akathisia:  No  Handed:  Right  AIMS (if indicated):     Assets:  Architect Housing Physical Health Resilience Social Support  ADL's:  Intact  Cognition:  WNL  Sleep:  Number of Hours: 7.25     Treatment Plan Summary: Daily contact with patient to assess and evaluate symptoms and progress in treatment and Medication management . Pt still psychotic.  Brandon Dodson is a 36 year old male with a history of bipolar illness admitted floridly manic and psychotic.  1. Mood and psychosis. Continue po Zyprexa, with  FMP .   2. Substance abuse. UDS was not completed  on admission. We have no information about substance use.   3. Insomnia. Trazodone is available.  4. Anxiety. Vistaril is available.  5. Metabolic syndrome monitoring. Lipid panel, TSH, hemoglobin A1c are normal.   6. EKG. Pending.  7. Disposition. He will be discharged back to home. He will follow up with [facility] in [facility].   Beverly Sessions, MD 06/02/2017, 2:48 PMPatient ID: Brandon Dodson, male   DOB: March 28, 1981, 36 y.o.   MRN: [id number]

## 2017-06-02 NOTE — Progress Notes (Signed)
Patient stays to himself, not interaction with peers.  Minimal interaction with staff.  No group attendance.  Up at 1600 requesting to have nicotine patch. Patient also agreeable to take zyprexa at that time. Patient questioning how long he would have to be in the hospital. Explained that he was under IVC and it would be up to doctor as to when could be discharged.  Patient questioned how long IVC good for and informed that good for 7 days after which he would have to appear in court or either the doctor would send a letter to court.  Further informed that If he would like to go to court to make sure that he informed the doctor that he would like to go to court in person.  Support offered and safety checks maintained.  Denies SI/HI/AVH.

## 2017-06-02 NOTE — Plan of Care (Signed)
Problem: Activity: Goal: Interest or engagement in activities will improve Outcome: Not Progressing Patient not observed socializing with any peers. Per report he was isolative throughout the day and remains in his room at this time. Increased engagement encouraged.   Problem: Education: Goal: Emotional status will improve Outcome: Not Progressing Patient anxious with slight irritable edge on assessment. Stated he just wanted " something to be knocked out" in regard to prn medications. Administered PRN Vistaril 25 mg and discussed positive coping with patient to include physical activity. Patient encouraged to do small isolation exercises in his room. Will continue to monitor throughout the shift.   Problem: Coping: Goal: Ability to verbalize frustrations and anger appropriately will improve Patient verbalized anxiety and frustration but was unable to identify triggers and appeared unwilling to process with Clinical research associate. Support provided.   Problem: Safety: Goal: Periods of time without injury will increase Outcome: Progressing Patient remains free from injury and is currently safe on the unit. He denies thoughts of self harm at this time.

## 2017-06-02 NOTE — [facility] Group Notes (Signed)
LCSW Group Therapy Note  06/02/2017 1:15pm  Type of Therapy and Topic: Group Therapy: Holding on to Grudges   Participation Level: Did Not Attend   Description of Group:  In this group patients will be asked to explore and define a grudge. Patients will be guided to discuss their thoughts, feelings, and reasons as to why people have grudges. Patients will process the impact grudges have on daily life and identify thoughts and feelings related to holding grudges. Facilitator will challenge patients to identify ways to let go of grudges and the benefits this provides. Patients will be confronted to address why one struggles letting go of grudges. Lastly, patients will identify feelings and thoughts related to what life would look like without grudges. This group will be process-oriented, with patients participating in exploration of their own experiences, giving and receiving support, and processing challenge from other group members.  Therapeutic Goals:  1. Patient will identify specific grudges related to their personal life.  2. Patient will identify feelings, thoughts, and beliefs around grudges.  3. Patient will identify how one releases grudges appropriately.  4. Patient will identify situations where they could have let go of the grudge, but instead chose to hold on.   Summary of Patient Progress:   Therapeutic Modalities:  Cognitive Behavioral Therapy  Solution Focused Therapy  Motivational Interviewing  Brief Therapy   Lorri Frederick, LCSW 06/02/2017 2:21 PM

## 2017-06-03 MED ORDER — TRAZODONE HCL 50 MG PO TABS
150.0000 mg | ORAL_TABLET | Freq: Every evening | ORAL | Status: DC | PRN
  Administered 2017-06-03 – 2017-06-05 (×3): 150 mg via ORAL
  Filled 2017-06-03 (×3): qty 1

## 2017-06-03 NOTE — [facility] Counselor (Signed)
Adult Comprehensive Assessment  Patient ID: Brandon Dodson, male   DOB: 1980/12/03, 36 y.o.   MRN: [id number]  Information Source: Information source: Patient  Current Stressors:  Educational / Learning stressors: Pt refuses to discuss any further Employment / Job issues: Pt refuses to discuss any further Family Relationships: Pt refuses to discuss any further Financial / Lack of resources (include bankruptcy): Pt refuses to discuss any further Housing / Lack of housing: Pt refuses to discuss any further Physical health (include injuries & life threatening diseases): Pt refuses to discuss any further Social relationships: Pt refuses to discuss any further Substance abuse: Pt refuses to discuss any further Bereavement / Loss: Pt refuses to discuss any further  Living/Environment/Situation:  Living Arrangements: Alone  Family History:  Marital status: Single  Childhood History:  By whom was/is the patient raised?: Both parents  Education:  Highest grade of school patient has completed: HS Diploma Name of school: NA  Employment/Work Situation:   Employment situation:  (Pt will not disclose anything about his situation, but has told staff in the chart that he doesn't' worry about bills because he is "richDesigner, industrial/product Resources:      Alcohol/Substance Abuse:      Social Support System:    Per chart Pt was living with Girlfriend when he became worried about his safety thinking she was trying to hurt him.  Family also reports per chart that he believed he had been sprayed by a toxic chemical.  Leisure/Recreation:      Strengths/Needs:      Discharge Plan:      Summary/Recommendations:   Summary and Recommendations (to be completed by the evaluator): Pt brought to the ED by Police after what he describes as an "episode".  He will not elaborate on his current circumstances at this time.  Pt remains focused on his discharge however he is not willing to allow CSW to  speak with any family or friends. Pt verbalizes he is agreeable to local follow up provider so that he can continue to stay on medications.  It is reccommended that Pt follow up with agreed upon discharge plan and medication regimen.  Cleda Daub Ellerie Arenz.LCSW 06/03/2017

## 2017-06-03 NOTE — [facility] Group Notes (Signed)
[facility] Group Notes:  (Nursing/MHT/Case Management/Adjunct)  Date:  06/03/2017  Time:  5:12 AM  Type of Therapy:  Psychoeducational Skills  Participation Level:  Active  Participation Quality:  Appropriate and Attentive  Affect:  Appropriate  Cognitive:  Appropriate  Insight:  Appropriate  Engagement in Group:  Engaged  Modes of Intervention:  Discussion, Socialization and Support  Summary of Progress/Problems:  Chancy Milroy 06/03/2017, 5:12 AM

## 2017-06-03 NOTE — Progress Notes (Signed)
Patient has slept most of the day. He will come out for meals and does take his zyprexa. He did refuse his nicotine patch.  He is quiet and keeps to himself. His responses are short and brief. He states his goal is to get out of here.

## 2017-06-03 NOTE — Progress Notes (Signed)
Patient ID: Brandon Dodson, male   DOB: 11/19/1980, 36 y.o.   MRN: [id number] LCSW Group Therapy Note  06/03/2017 1:00pm  Type of Therapy and Topic:  Group Therapy:  Feelings around Relapse and Recovery  Participation Level:  Did Not Attend   Description of Group:    Patients in this group will discuss emotions they experience before and after a relapse. They will process how experiencing these feelings, or avoidance of experiencing them, relates to having a relapse. Facilitator will guide patients to explore emotions they have related to recovery. Patients will be encouraged to process which emotions are more powerful. They will be guided to discuss the emotional reaction significant others in their lives may have to their relapse or recovery. Patients will be assisted in exploring ways to respond to the emotions of others without this contributing to a relapse.  Therapeutic Goals: 1. Patient will identify two or more emotions that lead to a relapse for them 2. Patient will identify two emotions that result when they relapse 3. Patient will identify two emotions related to recovery 4. Patient will demonstrate ability to communicate their needs through discussion and/or role plays   Summary of Patient Progress:     Therapeutic Modalities:   Cognitive Behavioral Therapy Solution-Focused Therapy Assertiveness Training Relapse Prevention Therapy   Glennon Mac, LCSW 06/03/2017 3:08 PM

## 2017-06-03 NOTE — Progress Notes (Signed)
Brandon Dodson cooperative with treatment, he was visible in the milieu with peers, but he had minimal interaction with peers and staff on shift.  He was medication complaint although on shift. He remains guarded on approach, no behavioral issues to report on shift at this time.

## 2017-06-03 NOTE — [facility] Suicide Risk Assessment (Signed)
[facility] INPATIENT:  Family/Significant Other Suicide Prevention Education  Suicide Prevention Education:  Patient Refusal for Family/Significant Other Suicide Prevention Education: The patient Brandon Dodson has refused to provide written consent for family/significant other to be provided Family/Significant Other Suicide Prevention Education during admission and/or prior to discharge.  Physician notified.  Cleda Daub Octivia Canion, LCSW 06/03/2017, 4:27 PM

## 2017-06-03 NOTE — Progress Notes (Addendum)
[facility] MD Progress Note  06/03/2017 12:09 PM Brandon Dodson  MRN:  [id number] Subjective:   Mr. Luecke is a 36 year old male with likely diagnosis of bipolar disorder admitted for bizarre behavior and paranoia. He was started on Zyprexa in the emergency room but did refuse medications last night.Pt on FMP.   9/16- Chart reviewed, discussed with nursing staff, pt seen. Pt continue to be isolative, withdrawn, paranoid, reluctant to take medicine but took po med, not needing FMP. Pt needed extra prn trazodone 50 mg  at night with vistaril prn.  Pt in his room, lying in mattress on the floor, very guarded, does not make eye contact, States " I'm fine, nothing going on"  denies AVH, denies SI/HI, refuses to talk more.      Per nursing:  Patient stays to himself, not interaction with peers.  Minimal interaction with staff.  No group attendance.  Up at 1600 requesting to have nicotine patch. Patient also agreeable to take zyprexa at that time. Patient questioning how long he would have to be in the hospital. Explained that he was under IVC and it would be up to doctor as to when could be discharged.  Patient questioned how long IVC good for and informed that good for 7 days after which he would have to appear in court or either the doctor would send a letter to court.  Further informed that If he would like to go to court to make sure that he informed the doctor that he would like to go to court in person.  Support offered and safety checks maintained.  Denies SI/HI/AVH.    Principal Problem: Bipolar I disorder, current or most recent episode manic, with psychotic features (HCC) Diagnosis:   Patient Active Problem List   Diagnosis Date Noted  . Bipolar I disorder, current or most recent episode manic, with psychotic features (HCC) [F31.2] 05/31/2017  . Tobacco use disorder [F17.200] 05/31/2017   Total Time spent with patient: 30 minutes  Past Psychiatric History: bipolar illness.  Past Medical  History: History reviewed. No pertinent past medical history.  Past Surgical History:  Procedure Laterality Date  . DENTAL SURGERY     Family History: History reviewed. No pertinent family history. Family Psychiatric  History: unknown. Social History:  History  Alcohol Use  . Yes     History  Drug use: Unknown    Social History   Social History  . Marital status: Single    Spouse name: N/A  . Number of children: N/A  . Years of education: N/A   Social History Main Topics  . Smoking status: Current Every Day Smoker    Packs/day: 1.00    Types: Cigarettes  . Smokeless tobacco: Never Used  . Alcohol use Yes  . Drug use: Unknown  . Sexual activity: Not Asked   Other Topics Concern  . None   Social History Narrative  . None   Additional Social History:                         Sleep: Fair  Appetite:  Fair  Current Medications: Current Facility-Administered Medications  Medication Dose Route Frequency Provider Last Rate Last Dose  . acetaminophen (TYLENOL) tablet 650 mg  650 mg Oral Q6H PRN [name], [name], MD      . alum & mag hydroxide-simeth (MAALOX/MYLANTA) 200-200-20 MG/5ML suspension 30 mL  30 mL Oral Q4H PRN [name], [name], MD      .  hydrOXYzine (ATARAX/VISTARIL) tablet 25 mg  25 mg Oral TID PRN [name], [name], MD   25 mg at 06/02/17 1932  . magnesium hydroxide (MILK OF MAGNESIA) suspension 30 mL  30 mL Oral Daily PRN [name], [name], MD      . nicotine (NICODERM CQ - dosed in mg/24 hours) patch 21 mg  21 mg Transdermal Daily [name], [name], MD   21 mg at 06/02/17 1554  . OLANZapine zydis (ZYPREXA) disintegrating tablet 20 mg  20 mg Oral Daily Beverly Sessions, MD   20 mg at 06/02/17 1551   Or  . OLANZapine (ZYPREXA) injection 10 mg  10 mg Intramuscular Daily Beverly Sessions, MD      . traZODone (DESYREL) tablet 100 mg  100 mg Oral QHS PRN [name], [name], MD   100 mg at 06/02/17 0031    Lab Results:  No results found for this or any  previous visit (from the past 48 hour(s)).  Blood Alcohol level:  Lab Results  Component Value Date   ETH <5 05/29/2017    Metabolic Disorder Labs: Lab Results  Component Value Date   HGBA1C 4.8 06/01/2017   MPG 91.06 06/01/2017   No results found for: PROLACTIN Lab Results  Component Value Date   CHOL 142 06/01/2017   TRIG 87 06/01/2017   HDL 32 (L) 06/01/2017   CHOLHDL 4.4 06/01/2017   VLDL 17 06/01/2017   LDLCALC 93 06/01/2017    Physical Findings: AIMS: Facial and Oral Movements Muscles of Facial Expression: None, normal Lips and Perioral Area: None, normal Jaw: None, normal Tongue: None, normal,Extremity Movements Upper (arms, wrists, hands, fingers): None, normal Lower (legs, knees, ankles, toes): None, normal, Trunk Movements Neck, shoulders, hips: None, normal, Overall Severity Severity of abnormal movements (highest score from questions above): None, normal Incapacitation due to abnormal movements: None, normal Patient's awareness of abnormal movements (rate only patient's report): No Awareness, Dental Status Current problems with teeth and/or dentures?: No Does patient usually wear dentures?: No  CIWA:  CIWA-Ar Total: 0 COWS:  COWS Total Score: 0  Musculoskeletal: Strength & Muscle Tone: within normal limits Gait & Station: normal Patient leans: N/A  Psychiatric Specialty Exam: Physical Exam  Nursing note and vitals reviewed. Psychiatric: His speech is normal. His affect is labile. He is slowed and withdrawn. Thought content is paranoid and delusional. Cognition and memory are normal. He expresses impulsivity.    Review of Systems  Psychiatric/Behavioral: The patient has insomnia.   All other systems reviewed and are negative.   Blood pressure 103/73, pulse 67, temperature 97.9 F (36.6 C), temperature source Oral, resp. rate 18, height  (1.778 m), weight 78 kg (172 lb), SpO2 98 %.Body mass index is 24.68 kg/m.  General Appearance: Casual,  guarded  Eye Contact:  Minimal  Speech:  Pressured  Volume:  Normal  Mood:  I'm fine"  Affect: irritable, restricted  Thought Process:  Disorganized and Descriptions of Associations: Loose  Orientation:  Full (Time, Place, and Person)  Thought Content:  Delusions and Paranoid Ideation  Suicidal Thoughts:  No  Homicidal Thoughts:  No  Memory:  Immediate;   Poor Recent;   Poor Remote;   Poor  Judgement:  Poor  Insight:  Lacking  Psychomotor Activity:  Decreased  Concentration:  Concentration: Poor and Attention Span: Poor  Recall:  Poor  Fund of Knowledge:  Fair  Language:  Fair  Akathisia:  No  Handed:  Right  AIMS (if indicated):  Assets:  Architect Housing Physical Health Resilience Social Support  ADL's:  Intact  Cognition:  WNL  Sleep:  Number of Hours: 7.25     Treatment Plan Summary: Daily contact with patient to assess and evaluate symptoms and progress in treatment and Medication management . Pt still psychotic, guarded.  Mr. Terral is a 36 year old male with a history of bipolar illness admitted floridly manic and psychotic.  1. Mood and psychosis. Continue po Zyprexa, with  FMP .   2. Substance abuse. UDS was not completed on admission. We have no information about substance use.   3. Insomnia. Increase Trazodone  4. Anxiety. Vistaril is available.  5. Metabolic syndrome monitoring. Lipid panel, TSH, hemoglobin A1c are normal.   6. EKG. Pending.  7. Disposition. He will be discharged back to home. He will follow up with CBC in [facility].   Beverly Sessions, MD 06/03/2017, 12:09 PMPatient ID: Brandon Dodson, male   DOB: 10-30-1980, 36 y.o.   MRN: [id number] Patient ID: Brandon Dodson, male   DOB: 06-01-1981, 36 y.o.   MRN: [id number]

## 2017-06-04 NOTE — BH Assessment (Addendum)
D: Pt denies SI/HI/AVH. Pt is pleasant and cooperative. Pt stated he did not know why he was in here. Pt said he was bored because he had nothing to do in here. Pt responded "I'm a Brandon Dodson " when he was asked what he did for a living , referring to the fact that he did not have to work . Pt appears to be in denial , pt story does not appear to add up as being true.   A: Pt was offered support and encouragement. Pt was given scheduled medications. Pt was encourage to attend groups. Q 15 minute checks were done for safety.   R:Pt attends groups and interacts well with peers and staff. Pt is taking medication. Pt has no complaints.Pt receptive to treatment and safety maintained on unit.

## 2017-06-04 NOTE — H&P (Deleted)
Psychiatric Admission Assessment Adult  Patient Identification: Brandon Dodson MRN:  [id number] Date of Evaluation:  06/04/2017 Chief Complaint:  Bipolar I disorder, current or most recent episode manic, with psychotic features Principal Diagnosis: Bipolar I disorder, current or most recent episode manic, with psychotic features (HCC) Diagnosis:   Patient Active Problem List   Diagnosis Date Noted  . Bipolar I disorder, current or most recent episode manic, with psychotic features (HCC) [F31.2] 05/31/2017  . Tobacco use disorder [F17.200] 05/31/2017   History of Present Illness:   Identifying data. Brandon Dodson is a 36 year old male with history of bipolar illness.  Chief complaint."I don't know."  History of present illness. Information was obtained from the patient and the chart. The patient was brought to the emergency room by police for bizarre behavior. The patient claimed that he was sprayed with some sticky chemical that is harmful to him. He was unable to tell us who has done it. He was shirtless in the ER. A family the patient has been behaving strangely for the past few days. He was unable to sleep. He is frightened his girlfriend accusing her of trying to kill him. The patient himself is not able or willing to provide much information. He is very pleasant and palyful but does not want to answer any questions. The only information on able to get out of him is that he doesn't need to worry about his bills because he is "rich". He accepted Zyprexa last night in the emergency room. He was negative for substances of on admission but is unable to give me a clear answer about substance use.  Past psychiatric history. According to his mother, he had a psychotic break few years ago and was hospitalized. The patient denies.  Family psychiatric history. Father with bipolar who was hospitalized frequently here.  Social history. The patient was unwilling to tell me about his living or work  situation. We note that he has a mother and girlfriend. They were both contacted by emergency room physician. At this point the patient does not give [place] permission to contact his family. His mother informed social worker that the patient has court date in [place] at the end of the month. He does have a Clinical research associate and might end up in jail.   Total Time spent with patient: 1 hour  Is the patient at risk to self? No.  Has the patient been a risk to self in the past 6 months? No.  Has the patient been a risk to self within the distant past? No.  Is the patient a risk to others? No.  Has the patient been a risk to others in the past 6 months? No.  Has the patient been a risk to others within the distant past? No.   Prior Inpatient Therapy:   Prior Outpatient Therapy:    Alcohol Screening: 1. How often do you have a drink containing alcohol?: Never 9. Have you or someone else been injured as a result of your drinking?: No 10. Has a relative or friend or a doctor or another health worker been concerned about your drinking or suggested you cut down?: No Alcohol Use Disorder Identification Test Final Score (AUDIT): 0 Brief Intervention: AUDIT score less than 7 or less-screening does not suggest unhealthy drinking-brief intervention not indicated Substance Abuse History in the last 12 months:  No. Consequences of Substance Abuse: NA Previous Psychotropic Medications: Yes  Psychological Evaluations: No  Past Medical History: History reviewed. No pertinent past medical history.  Past Surgical History:  Procedure Laterality Date  . DENTAL SURGERY     Family History: History reviewed. No pertinent family history.  Tobacco Screening: Have you used any form of tobacco in the last 30 days? (Cigarettes, Smokeless Tobacco, Cigars, and/or Pipes): Yes Tobacco use, Select all that apply: 5 or more cigarettes per day Are you interested in Tobacco Cessation Medications?: No, patient refused Counseled patient  on smoking cessation including recognizing danger situations, developing coping skills and basic information about quitting provided: Refused/Declined practical counseling Social History:  History  Alcohol Use  . Yes     History  Drug use: Unknown    Additional Social History: Marital status: Single                         Allergies:  No Known Allergies Lab Results:  No results found for this or any previous visit (from the past 48 hour(s)).  Blood Alcohol level:  Lab Results  Component Value Date   ETH <5 05/29/2017    Metabolic Disorder Labs:  Lab Results  Component Value Date   HGBA1C 4.8 06/01/2017   MPG 91.06 06/01/2017   No results found for: PROLACTIN Lab Results  Component Value Date   CHOL 142 06/01/2017   TRIG 87 06/01/2017   HDL 32 (L) 06/01/2017   CHOLHDL 4.4 06/01/2017   VLDL 17 06/01/2017   LDLCALC 93 06/01/2017    Current Medications: Current Facility-Administered Medications  Medication Dose Route Frequency Provider Last Rate Last Dose  . acetaminophen (TYLENOL) tablet 650 mg  650 mg Oral Q6H PRN [name], [name], MD      . alum & mag hydroxide-simeth (MAALOX/MYLANTA) 200-200-20 MG/5ML suspension 30 mL  30 mL Oral Q4H PRN [name], [name], MD      . hydrOXYzine (ATARAX/VISTARIL) tablet 25 mg  25 mg Oral TID PRN [name], [name], MD   25 mg at 06/03/17 2119  . magnesium hydroxide (MILK OF MAGNESIA) suspension 30 mL  30 mL Oral Daily PRN [name], [name], MD      . nicotine (NICODERM CQ - dosed in mg/24 hours) patch 21 mg  21 mg Transdermal Daily Camillo Quadros B, MD   21 mg at 06/02/17 1554  . OLANZapine zydis (ZYPREXA) disintegrating tablet 20 mg  20 mg Oral Daily Beverly Sessions, MD   20 mg at 06/03/17 1544   Or  . OLANZapine (ZYPREXA) injection 10 mg  10 mg Intramuscular Daily Beverly Sessions, MD      . traZODone (DESYREL) tablet 150 mg  150 mg Oral QHS PRN Beverly Sessions, MD   150 mg at 06/03/17 2118   PTA  Medications: Prescriptions Prior to Admission  Medication Sig Dispense Refill Last Dose  . amoxicillin (AMOXIL) 500 MG tablet Take 1 tablet (500 mg total) by mouth 3 (three) times daily. (Patient not taking: Reported on 05/29/2017) 30 tablet 0 Completed Course at Unknown time  . traMADol (ULTRAM) 50 MG tablet Take 1 tablet (50 mg total) by mouth every 6 (six) hours as needed. (Patient not taking: Reported on 05/29/2017) 12 tablet 0 Completed Course at Unknown time    Musculoskeletal: Strength & Muscle Tone: within normal limits Gait & Station: normal Patient leans: N/A  Psychiatric Specialty Exam: Physical Exam  Nursing note and vitals reviewed. Constitutional: He is oriented to person, place, and time. He appears well-developed and well-nourished.  HENT:  Head: Normocephalic and atraumatic.  Eyes: Pupils are equal, round, and reactive  to light. Conjunctivae and EOM are normal.  Neck: Normal range of motion. Neck supple.  Cardiovascular: Normal rate, regular rhythm and normal heart sounds.   Respiratory: Effort normal and breath sounds normal.  GI: Soft. Bowel sounds are normal.  Musculoskeletal: Normal range of motion.  Neurological: He is alert and oriented to person, place, and time.  Skin: Skin is warm and dry.  Psychiatric: His affect is inappropriate. His speech is rapid and/or pressured. He is hyperactive. Thought content is paranoid and delusional. Cognition and memory are normal. He expresses impulsivity.    Review of Systems  Psychiatric/Behavioral: The patient has insomnia.   All other systems reviewed and are negative.   Blood pressure 103/73, pulse 67, temperature 97.9 F (36.6 C), temperature source Oral, resp. rate 18, height  (1.778 m), weight 78 kg (172 lb), SpO2 98 %.Body mass index is 24.68 kg/m.  See SRA.                                                  Sleep:  Number of Hours: 7    Treatment Plan Summary: Daily contact with  patient to assess and evaluate symptoms and progress in treatment and Medication management   Mr. Trudo is a 36 year old male with a history of bipolar illness admitted floridly manic and psychotic.  1. Mood and psychosis. He was started on Zyprexa in the emergency room. We'll increase the dose to 20 mg nightly.  2. Insomnia. Trazodone is available.  3. Anxiety. Vistaril is available.  4. Metabolic syndrome monitoring. Lipid panel, TSH, hemoglobin A1c are pending.  5. EKG. Pending.  6. Disposition. He will be discharged back to home. He will follow up with CBC in [facility]    Observation Level/Precautions:  15 minute checks  Laboratory:  CBC Chemistry Profile UDS UA  Psychotherapy:    Medications:    Consultations:    Discharge Concerns:    Estimated LOS:  Other:     Physician Treatment Plan for Primary Diagnosis: Bipolar I disorder, current or most recent episode manic, with psychotic features (HCC) Long Term Goal(s): Improvement in symptoms so as ready for discharge  Short Term Goals: Ability to identify changes in lifestyle to reduce recurrence of condition will improve, Ability to verbalize feelings will improve, Ability to disclose and discuss suicidal ideas, Ability to demonstrate self-control will improve, Ability to identify and develop effective coping behaviors will improve, Ability to maintain clinical measurements within normal limits will improve, Compliance with prescribed medications will improve and Ability to identify triggers associated with substance abuse/mental health issues will improve  Physician Treatment Plan for Secondary Diagnosis: Principal Problem:   Bipolar I disorder, current or most recent episode manic, with psychotic features (HCC) Active Problems:   Tobacco use disorder  Long Term Goal(s): NA  Short Term Goals: NA  I certify that inpatient services furnished can reasonably be expected to improve the patient's condition.    Kristine Linea, MD 9/17/201812:56 PM

## 2017-06-04 NOTE — Plan of Care (Signed)
Problem: Safety: Goal: Periods of time without injury will increase Outcome: Progressing Remains safe on the unit.   

## 2017-06-04 NOTE — Progress Notes (Signed)
Patient isolates to his room.  No interaction with peers noted.  Minimal interaction with staff.  Affect intense.  Medication compliant with encouragement. Good appetite.  No group attendance.  Support offered.  Safety rounds maintained.

## 2017-06-04 NOTE — Progress Notes (Signed)
[facility] MD Progress Note  06/04/2017 12:57 PM Brandon Dodson  MRN:  [id number] Subjective:   Brandon Dodson is a 36 year old male with likely diagnosis of bipolar disorder admitted for bizarre behavior and paranoia. He was started on Zyprexa in the emergency room but did refuse medications last night.Pt on FMP.  9/16- Chart reviewed, discussed with nursing staff, pt seen. Pt continue to be isolative, withdrawn, paranoid, reluctant to take medicine but took po med, not needing FMP. Pt needed extra prn trazodone 50 mg  at night with vistaril prn.  Pt in his room, lying in mattress on the floor, very guarded, does not make eye contact, States " I'm fine, nothing going on"  denies AVH, denies SI/HI, refuses to talk more.   06/04/2017. Brandon Dodson denies any symptoms of depression, anxiety, psychosis or symptoms suggestive of bipolar mania. For the past two days, he took Zyprexa with encouragement. Forced medication order is in place. He refuses to talk to social workers. He is very irritable. He spoke briefly with me this morning to make sure that he will be able to appear in court tomorrow to challenge his commitment. Apparently he sleep at night and most of the day. No behavioral problems.    Per nursing:  Brandon Dodson cooperative with treatment, he was visible in the milieu with peers, but he had minimal interaction with peers and staff on shift.  He was medication complaint although on shift. He remains guarded on approach, no behavioral issues to report on shift at this time.   Principal Problem: Bipolar I disorder, current or most recent episode manic, with psychotic features (HCC) Diagnosis:   Patient Active Problem List   Diagnosis Date Noted  . Bipolar I disorder, current or most recent episode manic, with psychotic features (HCC) [F31.2] 05/31/2017  . Tobacco use disorder [F17.200] 05/31/2017   Total Time spent with patient: 30 minutes  Past Psychiatric History: bipolar illness.  Past Medical  History: History reviewed. No pertinent past medical history.  Past Surgical History:  Procedure Laterality Date  . DENTAL SURGERY     Family History: History reviewed. No pertinent family history. Family Psychiatric  History: unknown. Social History:  History  Alcohol Use  . Yes     History  Drug use: Unknown    Social History   Social History  . Marital status: Single    Spouse name: N/A  . Number of children: N/A  . Years of education: N/A   Social History Main Topics  . Smoking status: Current Every Day Smoker    Packs/day: 1.00    Types: Cigarettes  . Smokeless tobacco: Never Used  . Alcohol use Yes  . Drug use: Unknown  . Sexual activity: Not Asked   Other Topics Concern  . None   Social History Narrative  . None   Additional Social History:                         Sleep: Fair  Appetite:  Fair  Current Medications: Current Facility-Administered Medications  Medication Dose Route Frequency Provider Last Rate Last Dose  . acetaminophen (TYLENOL) tablet 650 mg  650 mg Oral Q6H PRN [name], [name], MD      . alum & mag hydroxide-simeth (MAALOX/MYLANTA) 200-200-20 MG/5ML suspension 30 mL  30 mL Oral Q4H PRN [name], [name], MD      . hydrOXYzine (ATARAX/VISTARIL) tablet 25 mg  25 mg Oral TID PRN [name], [name], MD  25 mg at 06/03/17 2119  . magnesium hydroxide (MILK OF MAGNESIA) suspension 30 mL  30 mL Oral Daily PRN [name], [name], MD      . nicotine (NICODERM CQ - dosed in mg/24 hours) patch 21 mg  21 mg Transdermal Daily Nicholad Kautzman B, MD   21 mg at 06/02/17 1554  . OLANZapine zydis (ZYPREXA) disintegrating tablet 20 mg  20 mg Oral Daily Beverly Sessions, MD   20 mg at 06/03/17 1544   Or  . OLANZapine (ZYPREXA) injection 10 mg  10 mg Intramuscular Daily Beverly Sessions, MD      . traZODone (DESYREL) tablet 150 mg  150 mg Oral QHS PRN Beverly Sessions, MD   150 mg at 06/03/17 2118    Lab Results:  No results found for this or any  previous visit (from the past 48 hour(s)).  Blood Alcohol level:  Lab Results  Component Value Date   ETH <5 05/29/2017    Metabolic Disorder Labs: Lab Results  Component Value Date   HGBA1C 4.8 06/01/2017   MPG 91.06 06/01/2017   No results found for: PROLACTIN Lab Results  Component Value Date   CHOL 142 06/01/2017   TRIG 87 06/01/2017   HDL 32 (L) 06/01/2017   CHOLHDL 4.4 06/01/2017   VLDL 17 06/01/2017   LDLCALC 93 06/01/2017    Physical Findings: AIMS: Facial and Oral Movements Muscles of Facial Expression: None, normal Lips and Perioral Area: None, normal Jaw: None, normal Tongue: None, normal,Extremity Movements Upper (arms, wrists, hands, fingers): None, normal Lower (legs, knees, ankles, toes): None, normal, Trunk Movements Neck, shoulders, hips: None, normal, Overall Severity Severity of abnormal movements (highest score from questions above): None, normal Incapacitation due to abnormal movements: None, normal Patient's awareness of abnormal movements (rate only patient's report): No Awareness, Dental Status Current problems with teeth and/or dentures?: No Does patient usually wear dentures?: No  CIWA:  CIWA-Ar Total: 0 COWS:  COWS Total Score: 0  Musculoskeletal: Strength & Muscle Tone: within normal limits Gait & Station: normal Patient leans: N/A  Psychiatric Specialty Exam: Physical Exam  Nursing note and vitals reviewed. Psychiatric: His speech is normal. His affect is labile. He is slowed and withdrawn. Thought content is paranoid and delusional. Cognition and memory are normal. He expresses impulsivity.    Review of Systems  Psychiatric/Behavioral: The patient has insomnia.   All other systems reviewed and are negative.   Blood pressure 103/73, pulse 67, temperature 97.9 F (36.6 C), temperature source Oral, resp. rate 18, height  (1.778 m), weight 78 kg (172 lb), SpO2 98 %.Body mass index is 24.68 kg/m.  General Appearance: Casual,  guarded  Eye Contact:  Minimal  Speech:  Pressured  Volume:  Normal  Mood:  I'm fine"  Affect: irritable, restricted  Thought Process:  Disorganized and Descriptions of Associations: Loose  Orientation:  Full (Time, Place, and Person)  Thought Content:  Delusions and Paranoid Ideation  Suicidal Thoughts:  No  Homicidal Thoughts:  No  Memory:  Immediate;   Poor Recent;   Poor Remote;   Poor  Judgement:  Poor  Insight:  Lacking  Psychomotor Activity:  Decreased  Concentration:  Concentration: Poor and Attention Span: Poor  Recall:  Poor  Fund of Knowledge:  Fair  Language:  Fair  Akathisia:  No  Handed:  Right  AIMS (if indicated):     Assets:  Architect Housing Physical Health Resilience Social Support  ADL's:  Intact  Cognition:  WNL  Sleep:  Number of Hours: 7     Treatment Plan Summary: Daily contact with patient to assess and evaluate symptoms and progress in treatment and Medication management .   Mr. Arnesen is a 36 year old male with a history of bipolar illness admitted floridly manic and psychotic.  1. Mood and psychosis. Continue po Zyprexa for psychosis and mood stabilization. Forced medications order is in place.    2. Substance abuse. UDS was not completed on admission. We have no information about substance use.   3. Insomnia. Increase Trazodone  4. Anxiety. Vistaril is available.  5. Metabolic syndrome monitoring. Lipid panel, TSH, hemoglobin A1c are normal.   6. EKG. Pending.  7. Disposition. He will be discharged back to home. He will follow up with [facility] in [facility].   Kristine Linea, MD

## 2017-06-04 NOTE — Plan of Care (Signed)
Problem: Safety: Goal: Periods of time without injury will increase Outcome: Progressing Pt safe on the unit at this time   
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
## 2017-06-05 MED ORDER — DIVALPROEX SODIUM 500 MG PO DR TAB
500.0000 mg | DELAYED_RELEASE_TABLET | Freq: Three times a day (TID) | ORAL | Status: DC
  Administered 2017-06-05 – 2017-06-09 (×12): 500 mg via ORAL
  Filled 2017-06-05 (×14): qty 1

## 2017-06-05 MED ORDER — HALOPERIDOL 5 MG PO TABS
10.0000 mg | ORAL_TABLET | Freq: Four times a day (QID) | ORAL | Status: DC | PRN
  Administered 2017-06-08 – 2017-06-10 (×4): 10 mg via ORAL
  Filled 2017-06-05 (×5): qty 2

## 2017-06-05 MED ORDER — CHLORPROMAZINE HCL 100 MG PO TABS
100.0000 mg | ORAL_TABLET | Freq: Once | ORAL | Status: AC
  Administered 2017-06-05: 100 mg via ORAL
  Filled 2017-06-05: qty 1

## 2017-06-05 MED ORDER — OLANZAPINE 10 MG PO TABS
20.0000 mg | ORAL_TABLET | Freq: Every day | ORAL | Status: DC
  Administered 2017-06-05 – 2017-06-07 (×3): 20 mg via ORAL
  Filled 2017-06-05 (×2): qty 2

## 2017-06-05 MED ORDER — DIPHENHYDRAMINE HCL 25 MG PO CAPS
50.0000 mg | ORAL_CAPSULE | Freq: Four times a day (QID) | ORAL | Status: DC | PRN
  Administered 2017-06-07: 50 mg via ORAL
  Filled 2017-06-05: qty 2

## 2017-06-05 MED ORDER — LORAZEPAM 2 MG PO TABS
2.0000 mg | ORAL_TABLET | Freq: Four times a day (QID) | ORAL | Status: DC | PRN
  Administered 2017-06-05: 2 mg via ORAL
  Filled 2017-06-05: qty 1

## 2017-06-05 MED ORDER — DIPHENHYDRAMINE HCL 50 MG/ML IJ SOLN
50.0000 mg | Freq: Four times a day (QID) | INTRAMUSCULAR | Status: DC | PRN
  Administered 2017-06-06 – 2017-06-07 (×3): 50 mg via INTRAMUSCULAR
  Filled 2017-06-05 (×3): qty 1

## 2017-06-05 MED ORDER — HALOPERIDOL LACTATE 5 MG/ML IJ SOLN
10.0000 mg | Freq: Four times a day (QID) | INTRAMUSCULAR | Status: DC | PRN
  Administered 2017-06-06 – 2017-06-09 (×4): 10 mg via INTRAMUSCULAR
  Filled 2017-06-05 (×4): qty 2

## 2017-06-05 MED ORDER — LORAZEPAM 2 MG/ML IJ SOLN
2.0000 mg | Freq: Four times a day (QID) | INTRAMUSCULAR | Status: DC | PRN
  Administered 2017-06-06 – 2017-06-09 (×6): 2 mg via INTRAMUSCULAR
  Filled 2017-06-05 (×7): qty 1

## 2017-06-05 MED ORDER — OLANZAPINE 20 MG PO TABS: 20.0000 mg | ORAL_TABLET | Freq: Every day | ORAL | 1 refills | Status: DC

## 2017-06-05 MED ORDER — LORAZEPAM 2 MG PO TABS
2.0000 mg | ORAL_TABLET | Freq: Once | ORAL | Status: AC
  Administered 2017-06-05: 2 mg via ORAL
  Filled 2017-06-05: qty 1

## 2017-06-05 NOTE — Progress Notes (Signed)
Per Donna Christen, [employer] reviewer Aundra Millet is in agreement that follow up plan will be 30 day appt at [facility] for med management. Garner Nash, MSW, LCSW Clinical Social Worker 06/05/2017 2:44 PM

## 2017-06-05 NOTE — Progress Notes (Signed)
[facility] MD Progress Note  06/05/2017 10:35 AM Alfie Rideaux  MRN:  [id number] Subjective:   Mr. Penton is a 36 year old male with likely diagnosis of bipolar disorder admitted for bizarre behavior and paranoia. He was started on Zyprexa in the emergency room but did refuse medications last night.Pt on FMP.  9/16- Chart reviewed, discussed with nursing staff, pt seen. Pt continue to be isolative, withdrawn, paranoid, reluctant to take medicine but took po med, not needing FMP. Pt needed extra prn trazodone 50 mg  at night with vistaril prn.  Pt in his room, lying in mattress on the floor, very guarded, does not make eye contact, States " I'm fine, nothing going on"  denies AVH, denies SI/HI, refuses to talk more.   06/04/2017. Mr. Tortorella denies any symptoms of depression, anxiety, psychosis or symptoms suggestive of bipolar mania. For the past two days, he took Zyprexa with encouragement. Forced medication order is in place. He refuses to talk to social workers. He is very irritable. He spoke briefly with me this morning to make sure that he will be able to appear in court tomorrow to challenge his commitment. Apparently he sleep at night and most of the day. No behavioral problems.   06/05/2017. Mr. Koors improves rapidly once compliant with medications. He requested to participate in court hearings today. He agreed to take medications in the community. He is intelligent and will present well in court. He has not been engaging with providers or peers during this hospitalization. He does not participate in programming. He would benefit from longer hospitalization to stabilize and gain more insight into his problems. He is already in legal trouble awaiting court date in [place].    Per nursing:  Patient isolates to his room.  No interaction with peers noted.  Minimal interaction with staff.  Affect intense.  Medication compliant with encouragement. Good appetite.  No group attendance.  Support offered.   Safety rounds maintained.    Principal Problem: Bipolar I disorder, current or most recent episode manic, with psychotic features (HCC) Diagnosis:   Patient Active Problem List   Diagnosis Date Noted  . Bipolar I disorder, current or most recent episode manic, with psychotic features (HCC) [F31.2] 05/31/2017  . Tobacco use disorder [F17.200] 05/31/2017   Total Time spent with patient: 30 minutes  Past Psychiatric History: bipolar illness.  Past Medical History: History reviewed. No pertinent past medical history.  Past Surgical History:  Procedure Laterality Date  . DENTAL SURGERY     Family History: History reviewed. No pertinent family history. Family Psychiatric  History: unknown. Social History:  History  Alcohol Use  . Yes     History  Drug use: Unknown    Social History   Social History  . Marital status: Single    Spouse name: N/A  . Number of children: N/A  . Years of education: N/A   Social History Main Topics  . Smoking status: Current Every Day Smoker    Packs/day: 1.00    Types: Cigarettes  . Smokeless tobacco: Never Used  . Alcohol use Yes  . Drug use: Unknown  . Sexual activity: Not Asked   Other Topics Concern  . None   Social History Narrative  . None   Additional Social History:                         Sleep: Fair  Appetite:  Fair  Current Medications: Current Facility-Administered Medications  Medication Dose  Route Frequency Provider Last Rate Last Dose  . acetaminophen (TYLENOL) tablet 650 mg  650 mg Oral Q6H PRN [name], [name], MD      . alum & mag hydroxide-simeth (MAALOX/MYLANTA) 200-200-20 MG/5ML suspension 30 mL  30 mL Oral Q4H PRN [name], [name], MD      . magnesium hydroxide (MILK OF MAGNESIA) suspension 30 mL  30 mL Oral Daily PRN [name], [name], MD      . nicotine (NICODERM CQ - dosed in mg/24 hours) patch 21 mg  21 mg Transdermal Daily Jaimere Feutz B, MD   21 mg at 06/04/17 1619  . OLANZapine (ZYPREXA)  injection 10 mg  10 mg Intramuscular Daily Kinzie Wickes B, MD      . OLANZapine (ZYPREXA) tablet 20 mg  20 mg Oral QHS Jayzen Paver B, MD      . traZODone (DESYREL) tablet 150 mg  150 mg Oral QHS PRN Beverly Sessions, MD   150 mg at 06/04/17 2109    Lab Results:  No results found for this or any previous visit (from the past 48 hour(s)).  Blood Alcohol level:  Lab Results  Component Value Date   ETH <5 05/29/2017    Metabolic Disorder Labs: Lab Results  Component Value Date   HGBA1C 4.8 06/01/2017   MPG 91.06 06/01/2017   No results found for: PROLACTIN Lab Results  Component Value Date   CHOL 142 06/01/2017   TRIG 87 06/01/2017   HDL 32 (L) 06/01/2017   CHOLHDL 4.4 06/01/2017   VLDL 17 06/01/2017   LDLCALC 93 06/01/2017    Physical Findings: AIMS: Facial and Oral Movements Muscles of Facial Expression: None, normal Lips and Perioral Area: None, normal Jaw: None, normal Tongue: None, normal,Extremity Movements Upper (arms, wrists, hands, fingers): None, normal Lower (legs, knees, ankles, toes): None, normal, Trunk Movements Neck, shoulders, hips: None, normal, Overall Severity Severity of abnormal movements (highest score from questions above): None, normal Incapacitation due to abnormal movements: None, normal Patient's awareness of abnormal movements (rate only patient's report): No Awareness, Dental Status Current problems with teeth and/or dentures?: No Does patient usually wear dentures?: No  CIWA:  CIWA-Ar Total: 0 COWS:  COWS Total Score: 0  Musculoskeletal: Strength & Muscle Tone: within normal limits Gait & Station: normal Patient leans: N/A  Psychiatric Specialty Exam: Physical Exam  Nursing note and vitals reviewed. Psychiatric: His speech is normal. His affect is labile. He is slowed and withdrawn. Thought content is paranoid and delusional. Cognition and memory are normal. He expresses impulsivity.    Review of Systems   Psychiatric/Behavioral: The patient has insomnia.   All other systems reviewed and are negative.   Blood pressure (!) 103/58, pulse 63, temperature (!) 97.5 F (36.4 C), temperature source Oral, resp. rate 16, height  (1.778 m), weight 78 kg (172 lb), SpO2 99 %.Body mass index is 24.68 kg/m.  General Appearance: Casual, guarded  Eye Contact:  Minimal  Speech:  Pressured  Volume:  Normal  Mood:  I'm fine"  Affect: irritable, restricted  Thought Process:  Disorganized and Descriptions of Associations: Loose  Orientation:  Full (Time, Place, and Person)  Thought Content:  Delusions and Paranoid Ideation  Suicidal Thoughts:  No  Homicidal Thoughts:  No  Memory:  Immediate;   Poor Recent;   Poor Remote;   Poor  Judgement:  Poor  Insight:  Lacking  Psychomotor Activity:  Decreased  Concentration:  Concentration: Poor and Attention Span: Poor  Recall:  Poor  Fund of Knowledge:  Fair  Language:  Fair  Akathisia:  No  Handed:  Right  AIMS (if indicated):     Assets:  Architect Housing Physical Health Resilience Social Support  ADL's:  Intact  Cognition:  WNL  Sleep:  Number of Hours: 8.15     Treatment Plan Summary: Daily contact with patient to assess and evaluate symptoms and progress in treatment and Medication management .   Mr. Chhim is a 36 year old male with a history of bipolar illness admitted floridly manic and psychotic.  1. Mood and psychosis. Continue po Zyprexa for psychosis and mood stabilization. Forced medications order is in place.    2. Substance abuse. UDS was not completed on admission. We have no information about substance use.   3. Insomnia. Increase Trazodone  4. Anxiety. Vistaril is available.  5. Metabolic syndrome monitoring. Lipid panel, TSH, hemoglobin A1c are normal.   6. EKG. Pending.  7. Disposition. He will be discharged back to home. He will follow up with [facility] in [facility].    Kristine Linea, MD

## 2017-06-05 NOTE — Progress Notes (Signed)
Patient ID: Brandon Dodson, male   DOB: December 21, 1980, 36 y.o.   MRN: [id number] CSW given verbal permission to call Pt's girlfriend.  CSW attempted to call Pt's Girlfriend Brandon Dodson at [phone] with use of language line 2x.  ([employer] were off site and requested we use language line) Both calls to Pt's girlfriend went straight to a voicemail that was "not set up" and unable to leave any messages.  CSW will follow up tomorrow to see if there are alternative numbers or family members that would be more accessible.  Jake Shark, LCSW

## 2017-06-05 NOTE — [facility] Group Notes (Signed)
[facility] Group Notes:  (Nursing/MHT/Case Management/Adjunct)  Date:  06/05/2017  Time:  1:56 PM  Type of Therapy:  Psychoeducational Skills  Participation Level:  Did Not Attend    Brandon Dodson 06/05/2017, 1:56 PM

## 2017-06-05 NOTE — Progress Notes (Signed)
Patient escorted off unit by this Clinical research associate to meet sheriff deputy to be transported to court for IVC hearing.

## 2017-06-05 NOTE — Progress Notes (Signed)
[facility] MD Progress Note  06/05/2017 3:02 PM Brandon Dodson  MRN:  [id number] Subjective:   Brandon Dodson is a 36 year old male with likely diagnosis of bipolar disorder admitted for bizarre behavior and paranoia. He was started on Zyprexa in the emergency room but did refuse medications last night.Pt on FMP.  9/16- Chart reviewed, discussed with nursing staff, pt seen. Pt continue to be isolative, withdrawn, paranoid, reluctant to take medicine but took po med, not needing FMP. Pt needed extra prn trazodone 50 mg  at night with vistaril prn.  Pt in his room, lying in mattress on the floor, very guarded, does not make eye contact, States " I'm fine, nothing going on"  denies AVH, denies SI/HI, refuses to talk more.   06/04/2017. Brandon Dodson denies any symptoms of depression, anxiety, psychosis or symptoms suggestive of bipolar mania. For the past two days, he took Zyprexa with encouragement. Forced medication order is in place. He refuses to talk to social workers. He is very irritable. He spoke briefly with me this morning to make sure that he will be able to appear in court tomorrow to challenge his commitment. Apparently he sleep at night and most of the day. No behavioral problems.   06/05/2017. Brandon Dodson improves rapidly once compliant with medications. He requested to participate in court hearings today. He agreed to take medications in the community. He is intelligent and will present well in court. He has not been engaging with providers or peers during this hospitalization. He does not participate in programming. He would benefit from longer hospitalization to stabilize and gain more insight into his problems. He is already in legal trouble awaiting court date in [place].  Brandon Dodson went to court and the judge agreed to continue his IVC for another 7 days. The patient is very adamant to leave immediately. He allowed me to call his mother Hinda Glatter [phone]. I spoke with her extensively. The  patient has not seen a psychiatrist or taken any medications since he moved to [place] at the beginning of this year. He should be on a combination of Zyprexa and Depakote. He also responded well to Abilify in the past but has never been on injectable and refuses injectable now. He has a history of violence when paranoid including threats with a gun for which legal charges are still pending in [place] with court date on 06/15/2017. When paranoid, he believed that his girlfriend was trying to kill him. She is afraid of him and does not want him to return to the house she owns.    Per nursing:  Patient isolates to his room.  No interaction with peers noted.  Minimal interaction with staff.  Affect intense.  Medication compliant with encouragement. Good appetite.  No group attendance.  Support offered.  Safety rounds maintained.    Principal Problem: Bipolar I disorder, current or most recent episode manic, with psychotic features (HCC) Diagnosis:   Patient Active Problem List   Diagnosis Date Noted  . Bipolar I disorder, current or most recent episode manic, with psychotic features (HCC) [F31.2] 05/31/2017  . Tobacco use disorder [F17.200] 05/31/2017   Total Time spent with patient: 30 minutes  Past Psychiatric History: bipolar illness.  Past Medical History: History reviewed. No pertinent past medical history.  Past Surgical History:  Procedure Laterality Date  . DENTAL SURGERY     Family History: History reviewed. No pertinent family history. Family Psychiatric  History: unknown. Social History:  History  Alcohol Use  .  Yes     History  Drug use: Unknown    Social History   Social History  . Marital status: Single    Spouse name: N/A  . Number of children: N/A  . Years of education: N/A   Social History Main Topics  . Smoking status: Current Every Day Smoker    Packs/day: 1.00    Types: Cigarettes  . Smokeless tobacco: Never Used  . Alcohol use Yes  . Drug use: Unknown  . Sexual  activity: Not Asked   Other Topics Concern  . None   Social History Narrative  . None   Additional Social History:                         Sleep: Fair  Appetite:  Fair  Current Medications: Current Facility-Administered Medications  Medication Dose Route Frequency Provider Last Rate Last Dose  . acetaminophen (TYLENOL) tablet 650 mg  650 mg Oral Q6H PRN [name], [name], MD      . alum & mag hydroxide-simeth (MAALOX/MYLANTA) 200-200-20 MG/5ML suspension 30 mL  30 mL Oral Q4H PRN [name], [name], MD      . magnesium hydroxide (MILK OF MAGNESIA) suspension 30 mL  30 mL Oral Daily PRN [name], [name], MD      . nicotine (NICODERM CQ - dosed in mg/24 hours) patch 21 mg  21 mg Transdermal Daily Laterrica Libman B, MD   21 mg at 06/04/17 1619  . OLANZapine (ZYPREXA) injection 10 mg  10 mg Intramuscular Daily Ayan Heffington B, MD      . OLANZapine (ZYPREXA) tablet 20 mg  20 mg Oral QHS Skylynne Schlechter B, MD      . traZODone (DESYREL) tablet 150 mg  150 mg Oral QHS PRN Beverly Sessions, MD   150 mg at 06/04/17 2109    Lab Results:  No results found for this or any previous visit (from the past 48 hour(s)).  Blood Alcohol level:  Lab Results  Component Value Date   ETH <5 05/29/2017    Metabolic Disorder Labs: Lab Results  Component Value Date   HGBA1C 4.8 06/01/2017   MPG 91.06 06/01/2017   No results found for: PROLACTIN Lab Results  Component Value Date   CHOL 142 06/01/2017   TRIG 87 06/01/2017   HDL 32 (L) 06/01/2017   CHOLHDL 4.4 06/01/2017   VLDL 17 06/01/2017   LDLCALC 93 06/01/2017    Physical Findings: AIMS: Facial and Oral Movements Muscles of Facial Expression: None, normal Lips and Perioral Area: None, normal Jaw: None, normal Tongue: None, normal,Extremity Movements Upper (arms, wrists, hands, fingers): None, normal Lower (legs, knees, ankles, toes): None, normal, Trunk Movements Neck, shoulders, hips: None, normal, Overall  Severity Severity of abnormal movements (highest score from questions above): None, normal Incapacitation due to abnormal movements: None, normal Patient's awareness of abnormal movements (rate only patient's report): No Awareness, Dental Status Current problems with teeth and/or dentures?: No Does patient usually wear dentures?: No  CIWA:  CIWA-Ar Total: 0 COWS:  COWS Total Score: 0  Musculoskeletal: Strength & Muscle Tone: within normal limits Gait & Station: normal Patient leans: N/A  Psychiatric Specialty Exam: Physical Exam  Nursing note and vitals reviewed. Psychiatric: His speech is normal. His affect is labile. He is slowed and withdrawn. Thought content is paranoid and delusional. Cognition and memory are normal. He expresses impulsivity.    Review of Systems  Psychiatric/Behavioral: The patient has insomnia.  All other systems reviewed and are negative.   Blood pressure (!) 103/58, pulse 63, temperature (!) 97.5 F (36.4 C), temperature source Oral, resp. rate 16, height  (1.778 m), weight 78 kg (172 lb), SpO2 99 %.Body mass index is 24.68 kg/m.  General Appearance: Casual, guarded  Eye Contact:  Minimal  Speech:  Pressured  Volume:  Normal  Mood:  I'm fine"  Affect: irritable, restricted  Thought Process:  Disorganized and Descriptions of Associations: Loose  Orientation:  Full (Time, Place, and Person)  Thought Content:  Delusions and Paranoid Ideation  Suicidal Thoughts:  No  Homicidal Thoughts:  No  Memory:  Immediate;   Poor Recent;   Poor Remote;   Poor  Judgement:  Poor  Insight:  Lacking  Psychomotor Activity:  Decreased  Concentration:  Concentration: Poor and Attention Span: Poor  Recall:  Poor  Fund of Knowledge:  Fair  Language:  Fair  Akathisia:  No  Handed:  Right  AIMS (if indicated):     Assets:  Architect Housing Physical Health Resilience Social Support  ADL's:  Intact  Cognition:  WNL   Sleep:  Number of Hours: 8.15     Treatment Plan Summary: Daily contact with patient to assess and evaluate symptoms and progress in treatment and Medication management .   Brandon Dodson is a 36 year old male with a history of bipolar illness admitted floridly manic and psychotic.  1. Mood and psychosis. Continue po Zyprexa for psychosis and mood stabilization. Forced medications order is in place.    2. Substance abuse. UDS was not completed on admission. We have no information about substance use.   3. Insomnia. Increase Trazodone  4. Anxiety. Vistaril is available.  5. Metabolic syndrome monitoring. Lipid panel, TSH, hemoglobin A1c are normal.   6. EKG. Pending.  7. Disposition. He will be discharged back to home. He will follow up with [facility] in [facility]   Kristine Linea, MD

## 2017-06-05 NOTE — Progress Notes (Signed)
Received back on unit from court.

## 2017-06-05 NOTE — Plan of Care (Signed)
Problem: Safety: Goal: Periods of time without injury will increase Outcome: Progressing Remains safe on the unit.   

## 2017-06-05 NOTE — Progress Notes (Signed)
Patient ID: Bell Cai, male   DOB: [birth year], 36 y.o.   MRN: [id number] LCSW Group Therapy Note 06/05/2017 9:00am  Type of Therapy and Topic:  Group Therapy:  Setting Goals  Participation Level:  Did Not Attend  Description of Group: In this process group, patients discussed using strengths to work toward goals and address challenges.  Patients identified two positive things about themselves and one goal they were working on.  Patients were given the opportunity to share openly and support each other's plan for self-empowerment.  The group discussed the value of gratitude and were encouraged to have a daily reflection of positive characteristics or circumstances.  Patients were encouraged to identify a plan to utilize their strengths to work on current challenges and goals.  Therapeutic Goals 1. Patient will verbalize personal strengths/positive qualities and relate how these can assist with achieving desired personal goals 2. Patients will verbalize affirmation of peers plans for personal change and goal setting 3. Patients will explore the value of gratitude and positive focus as related to successful achievement of goals 4. Patients will verbalize a plan for regular reinforcement of personal positive qualities and circumstances.  Summary of Patient Progress:       Therapeutic Modalities Cognitive Behavioral Therapy Motivational Interviewing    Glennon Mac, LCSW 06/05/2017 2:46 PM

## 2017-06-05 NOTE — Progress Notes (Signed)
Denies SI/HI/AVH.  Affect bright.  Patient smiling and talking openly with this Clinical research associate.   Verbalizes that when first arrived called mother and left nasty message.  Further states that he realizes that he was wrong.   Up to dayroom watching TV with peers.  Requested to shave. Presented to medication room at 1600 for zyprexa , informed that time had been changed to 2200.  Later came back and asked if could go ahead and take medication. Zyprexa given early at patients request.

## 2017-06-05 NOTE — Progress Notes (Signed)
Patient presents to nurses station stating that he feels like he is on edge and if there was anything he could have,.  Informed that would have to call the doctor.  Instructed to take deep breaths in and out slowly until I contact the doctor.  Dr.  Jennet Maduro notified.  Orders received.

## 2017-06-06 MED ORDER — TEMAZEPAM 15 MG PO CAPS
30.0000 mg | ORAL_CAPSULE | Freq: Every day | ORAL | Status: DC
  Administered 2017-06-06: 30 mg via ORAL
  Filled 2017-06-06: qty 2

## 2017-06-06 MED ORDER — LORAZEPAM 2 MG/ML IJ SOLN
2.0000 mg | Freq: Once | INTRAMUSCULAR | Status: AC
  Administered 2017-06-06: 2 mg via INTRAMUSCULAR

## 2017-06-06 MED ORDER — CHLORPROMAZINE HCL 100 MG PO TABS
100.0000 mg | ORAL_TABLET | Freq: Three times a day (TID) | ORAL | Status: DC | PRN
  Administered 2017-06-07 – 2017-06-10 (×5): 100 mg via ORAL
  Filled 2017-06-06 (×5): qty 1

## 2017-06-06 MED ORDER — PALIPERIDONE ER 3 MG PO TB24
6.0000 mg | ORAL_TABLET | Freq: Once | ORAL | Status: AC
  Administered 2017-06-06: 6 mg via ORAL
  Filled 2017-06-06: qty 2

## 2017-06-06 NOTE — [facility] Group Notes (Signed)
[facility] LCSW Group Therapy Note  Date/Time: 06/05/17, 1500  Type of Therapy/Topic:  Group Therapy:  Feelings about Diagnosis  Participation Level:  Active   Mood: frustrated   Description of Group:    This group will allow patients to explore their thoughts and feelings about diagnoses they have received. Patients will be guided to explore their level of understanding and acceptance of these diagnoses. Facilitator will encourage patients to process their thoughts and feelings about the reactions of others to their diagnosis, and will guide patients in identifying ways to discuss their diagnosis with significant others in their lives. This group will be process-oriented, with patients participating in exploration of their own experiences as well as giving and receiving support and challenge from other group members.   Therapeutic Goals: 1. Patient will demonstrate understanding of diagnosis as evidence by identifying two or more symptoms of the disorder:  2. Patient will be able to express two feelings regarding the diagnosis 3. Patient will demonstrate ability to communicate their needs through discussion and/or role plays  Summary of Patient Progress: CSW and pt discussed pt frustration over his continued stay in the hospital.  Pt shared with the group that he appeared in court today and the judge continued his IVC for 7 more days.  Pt was appropriate in talking about his desire to be released.  Pt stayed for a little over half of the group and then left.       Therapeutic Modalities:   Cognitive Behavioral Therapy Brief Therapy Feelings Identification   Daleen Squibb, LCSW

## 2017-06-06 NOTE — Progress Notes (Signed)
Refused to attend treatment team meeting this am.  Has remained in bed asleep.  Did not get up for breakfast or lunch.  Up for dinner and then received medications.  Medicated for anxiety x 1 with good results.  Affect tense.  Support offered.  Safety maintained.

## 2017-06-06 NOTE — Progress Notes (Signed)
D: Pt denies SI/HI/AVH. Pt is irritable, his affect is blunted and tense. Pt appears anxious and he was asked to use his coping skills before been eventually given 2 mg lorazepam.  A: Pt was offered support and encouragement. Pt was given scheduled medications. Pt was encouraged to attend groups. Q 15 minute checks were done for safety.  R:Pt attends groups and interacts well with peers and staff. Pt is taking medication. Pt stated he wasn't satisfied he still wanted more medication for sleep., he kept pacing the unit becoming agitated. MD on call called thorazine 100 mg was given. Within an hour patient was at the nurses station getting irritable upset saying he cant sleep. MD on call lorazepam ,  benadryl 50 mg and Haldol 10 mg was given. Patient only stayed in his room for 30 minutes returned to the nurses station requesting for more medication, writer told him she couldn't give him any more medication he has to be patient for the medication given to work.    Patient continues to be angry, stormed into his room. Will continue to monitor.

## 2017-06-06 NOTE — Plan of Care (Signed)
Problem: Safety: Goal: Periods of time without injury will increase Outcome: Progressing Safety maintained.

## 2017-06-06 NOTE — Progress Notes (Signed)
D: Patient is alert and oriented x 4, denies SI/HI/AVH, affect is blunted, appears less tense Patient  was noted sleeping earlier as Clinical research associate came on shift. Patient  he did not attend evening group or go for snack time. Pt appears less anxious, minimal interaction with peers and staff. Patient is noted to be medication seeking always asking for various medication before they are due and when explained to him he has to wait he makes verbal threats towards staff.  A: Pt was offered support and encouragement. Pt was given scheduled medications. Pt was encouraged to attend groups. Q 15 minute checks were done for safety.  R:Pt did not attend evening groups. Pt is taking medication. Pt is not receptive to treatment on the unit. 15 minutes safety rounds maintained on unit, will continue to monitor.

## 2017-06-06 NOTE — [facility] Group Notes (Signed)
[facility] Group Notes:  (Nursing/MHT/Case Management/Adjunct)  Date:  06/06/2017  Time:  6:12 PM  Type of Therapy:  Psychoeducational Skills  Participation Level:  Did Not Attend  Lynelle Smoke [facility] 06/06/2017, 6:12 PM

## 2017-06-06 NOTE — [facility] Group Notes (Signed)
  [facility] LCSW Group Therapy Note  Date/Time: 06/06/17, 0930  Type of Therapy/Topic:  Group Therapy:  Emotion Regulation  Participation Level:  Did Not Attend   Mood:  Description of Group:    The purpose of this group is to assist patients in learning to regulate negative emotions and experience positive emotions. Patients will be guided to discuss ways in which they have been vulnerable to their negative emotions. These vulnerabilities will be juxtaposed with experiences of positive emotions or situations, and patients challenged to use positive emotions to combat negative ones. Special emphasis will be placed on coping with negative emotions in conflict situations, and patients will process healthy conflict resolution skills.  Therapeutic Goals: 1. Patient will identify two positive emotions or experiences to reflect on in order to balance out negative emotions:  2. Patient will label two or more emotions that they find the most difficult to experience:  3. Patient will be able to demonstrate positive conflict resolution skills through discussion or role plays:   Summary of Patient Progress:       Therapeutic Modalities:   Cognitive Behavioral Therapy Feelings Identification Dialectical Behavioral Therapy  Greg Sahira Cataldi, LCSW 

## 2017-06-06 NOTE — Tx Team (Signed)
Interdisciplinary Treatment and Diagnostic Plan Update  06/06/2017 Time of Session: 10:30am Brandon Dodson MRN: [id number]  Principal Diagnosis: Bipolar I disorder, current or most recent episode manic, with psychotic features ([place])  Secondary Diagnoses: Principal Problem:   Bipolar I disorder, current or most recent episode manic, with psychotic features ([place]) Active Problems:   Tobacco use disorder   Current Medications:  Current Facility-Administered Medications  Medication Dose Route Frequency Provider Last Rate Last Dose  . acetaminophen (TYLENOL) tablet 650 mg  650 mg Oral Q6H PRN [name], [name], MD      . alum & mag hydroxide-simeth (MAALOX/MYLANTA) 200-200-20 MG/5ML suspension 30 mL  30 mL Oral Q4H PRN [name], [name], MD      . diphenhydrAMINE (BENADRYL) capsule 50 mg  50 mg Oral Q6H PRN [name], [name], MD       Or  . diphenhydrAMINE (BENADRYL) injection 50 mg  50 mg Intramuscular Q6H PRN [name], [name], MD   50 mg at 06/06/17 0044  . divalproex (DEPAKOTE) DR tablet 500 mg  500 mg Oral Q8H [name], [name], MD   500 mg at 06/06/17 0641  . haloperidol (HALDOL) tablet 10 mg  10 mg Oral Q6H PRN [name], [name], MD       Or  . haloperidol lactate (HALDOL) injection 10 mg  10 mg Intramuscular Q6H PRN [name], [name], MD   10 mg at 06/06/17 0044  . LORazepam (ATIVAN) injection 2 mg  2 mg Intramuscular Q6H PRN [name], [name], MD      . magnesium hydroxide (MILK OF MAGNESIA) suspension 30 mL  30 mL Oral Daily PRN [name], [name], MD      . nicotine (NICODERM CQ - dosed in mg/24 hours) patch 21 mg  21 mg Transdermal Daily [name], [name], MD   21 mg at 06/05/17 1640  . OLANZapine (ZYPREXA) injection 10 mg  10 mg Intramuscular Daily [name], [name], MD      . OLANZapine (ZYPREXA) tablet 20 mg  20 mg Oral QHS [name], [name], MD   20 mg at 06/05/17 1824  . traZODone (DESYREL) tablet 150 mg  150 mg Oral QHS PRN Lenward Chancellor, MD    150 mg at 06/05/17 2119   PTA Medications: Prescriptions Prior to Admission  Medication Sig Dispense Refill Last Dose  . amoxicillin (AMOXIL) 500 MG tablet Take 1 tablet (500 mg total) by mouth 3 (three) times daily. (Patient not taking: Reported on 05/29/2017) 30 tablet 0 Completed Course at Unknown time  . traMADol (ULTRAM) 50 MG tablet Take 1 tablet (50 mg total) by mouth every 6 (six) hours as needed. (Patient not taking: Reported on 05/29/2017) 12 tablet 0 Completed Course at Unknown time   Patient Stressors: Other: paranoia   Patient Strengths: Ability for insight  Treatment Modalities: Medication Management, Group therapy, Case management,  1 to 1 session with clinician, Psychoeducation, Recreational therapy.   Physician Treatment Plan for Primary Diagnosis: Bipolar I disorder, current or most recent episode manic, with psychotic features ([place]) Long Term Goal(s): Improvement in symptoms so as ready for discharge NA   Short Term Goals: Ability to identify changes in lifestyle to reduce recurrence of condition will improve Ability to verbalize feelings will improve Ability to disclose and discuss suicidal ideas Ability to demonstrate self-control will improve Ability to identify and develop effective coping behaviors will improve Ability to maintain clinical measurements within normal limits will improve Compliance with prescribed medications will improve Ability to  identify triggers associated with substance abuse/mental health issues will improve NA  Medication Management: Evaluate patient's response, side effects, and tolerance of medication regimen.  Therapeutic Interventions: 1 to 1 sessions, Unit Group sessions and Medication administration.  Evaluation of Outcomes: Not Met  Physician Treatment Plan for Secondary Diagnosis: Principal Problem:   Bipolar I disorder, current or most recent episode manic, with psychotic features ([place]) Active Problems:   Tobacco  use disorder  Long Term Goal(s): Improvement in symptoms so as ready for discharge NA   Short Term Goals: Ability to identify changes in lifestyle to reduce recurrence of condition will improve Ability to verbalize feelings will improve Ability to disclose and discuss suicidal ideas Ability to demonstrate self-control will improve Ability to identify and develop effective coping behaviors will improve Ability to maintain clinical measurements within normal limits will improve Compliance with prescribed medications will improve Ability to identify triggers associated with substance abuse/mental health issues will improve NA     Medication Management: Evaluate patient's response, side effects, and tolerance of medication regimen.  Therapeutic Interventions: 1 to 1 sessions, Unit Group sessions and Medication administration.  Evaluation of Outcomes: Not Met   RN Treatment Plan for Primary Diagnosis: Bipolar I disorder, current or most recent episode manic, with psychotic features ([place]) Long Term Goal(s): Knowledge of disease and therapeutic regimen to maintain health will improve  Short Term Goals: Ability to identify and develop effective coping behaviors will improve and Compliance with prescribed medications will improve  Medication Management: RN will administer medications as ordered by provider, will assess and evaluate patient's response and provide education to patient for prescribed medication. RN will report any adverse and/or side effects to prescribing provider.  Therapeutic Interventions: 1 on 1 counseling sessions, Psychoeducation, Medication administration, Evaluate responses to treatment, Monitor vital signs and CBGs as ordered, Perform/monitor CIWA, COWS, AIMS and Fall Risk screenings as ordered, Perform wound care treatments as ordered.  Evaluation of Outcomes: Not Met   LCSW Treatment Plan for Primary Diagnosis: Bipolar I disorder, current or most recent  episode manic, with psychotic features ([place]) Long Term Goal(s): Safe transition to appropriate next level of care at discharge, Engage patient in therapeutic group addressing interpersonal concerns.  Short Term Goals: Engage patient in aftercare planning with referrals and resources and Increase skills for wellness and recovery  Therapeutic Interventions: Assess for all discharge needs, 1 to 1 time with Social worker, Explore available resources and support systems, Assess for adequacy in community support network, Educate family and significant other(s) on suicide prevention, Complete Psychosocial Assessment, Interpersonal group therapy.  Evaluation of Outcomes: Not Met   Progress in Treatment: Attending groups: No. Participating in groups: No. Taking medication as prescribed: No. Toleration medication: No. Family/Significant other contact made: No, will contact:  when given permission Patient understands diagnosis: No. Discussing patient identified problems/goals with staff: No. Medical problems stabilized or resolved: Yes. Denies suicidal/homicidal ideation: Yes. Issues/concerns per patient self-inventory: No. Other: none  New problem(s) identified: No, Describe:  none  New Short Term/Long Term Goal(s):  Discharge Plan or Barriers: Pt refusing to participate in Treatment team meeting. TBD, depends on where he will be living.  Reason for Continuation of Hospitalization: Delusions  Hallucinations  Estimated Length of Stay:5-7 days.  Attendees: Patient:Refused to Participate 06/06/2017 12:51 PM  Physician: Herma Ard [name] 06/06/2017 12:51 PM  Nursing: Polly Cobia, RN 06/06/2017 12:51 PM  RN Care Manager: 06/06/2017 12:51 PM  Social Worker: Dossie Arbour, LCSW 06/06/2017 12:51 PM  Recreational Therapist:  06/06/2017 12:51  PM  Other:  06/06/2017 12:51 PM  Other:  06/06/2017 12:51 PM  Other: 06/06/2017 12:51 PM    Scribe for Treatment Team: August Saucer,  LCSW 06/06/2017 12:51 PM

## 2017-06-06 NOTE — Progress Notes (Signed)
Patient noted laying in with eyes closed in bed and snoring, respiration even and non labored, no distress noted, will continue to closely monitor.

## 2017-06-06 NOTE — [facility] Group Notes (Signed)
[facility] Group Notes:  (Nursing/MHT/Case Management/Adjunct)  Date:  06/06/2017  Time:  1:45 AM  Type of Therapy:  Psychoeducational Skills  Participation Level:  Active  Participation Quality:  Appropriate, Attentive and Sharing  Affect:  Appropriate  Cognitive:  Appropriate  Insight:  Appropriate and Good  Engagement in Group:  Engaged  Modes of Intervention:  Discussion, Socialization and Support  Summary of Progress/Problems:  Chancy Milroy 06/06/2017, 1:45 AM
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
## 2017-06-06 NOTE — Progress Notes (Signed)
Presents to nurses looking drowsy and states "I don't feel right"  When asked what is going on states "I feel like I did last night, on edge."  Asked what he could do to help with his anxiety states "I am anxious because I am in here"  Informed that he needed to start looking for things that he can do to help ease his anxiety.  Explained and demonstrated deep breathing.  Further stated that continue to have to take IM medications for anxiety would extend his stay here.  Verbalized understanding.

## 2017-06-06 NOTE — Progress Notes (Signed)
LCSW Group Therapy Note  06/06/2017 3pm  Type of Therapy/Topic:  Group Therapy:  Emotion Regulation  Participation Level:  Did Not Attend   Description of Group:   The purpose of this group is to assist patients in learning to regulate negative emotions and experience positive emotions. Patients will be guided to discuss ways in which they have been vulnerable to their negative emotions. These vulnerabilities will be juxtaposed with experiences of positive emotions or situations, and patients will be challenged to use positive emotions to combat negative ones. Special emphasis will be placed on coping with negative emotions in conflict situations, and patients will process healthy conflict resolution skills.  Therapeutic Goals: 1. Patient will identify two positive emotions or experiences to reflect on in order to balance out negative emotions 2. Patient will label two or more emotions that they find the most difficult to experience 3. Patient will demonstrate positive conflict resolution skills through discussion and/or role plays  Summary of Patient Progress:       Therapeutic Modalities:   Cognitive Behavioral Therapy Feelings Identification Dialectical Behavioral Therapy   Glennon Mac, LCSW 06/06/2017 5:37 PM Patient ID: Brandon Dodson, male   DOB: 12-19-1980, 36 y.o.   MRN: [id number]

## 2017-06-06 NOTE — Plan of Care (Signed)
Problem: Coping: Goal: Ability to verbalize frustrations and anger appropriately will improve Outcome: Progressing Patient verbalize frustration over insomnia ; he believes he is not well medicated.

## 2017-06-07 MED ORDER — TEMAZEPAM 15 MG PO CAPS
15.0000 mg | ORAL_CAPSULE | Freq: Every day | ORAL | Status: DC
  Administered 2017-06-07: 15 mg via ORAL
  Filled 2017-06-07 (×2): qty 1

## 2017-06-07 MED ORDER — LORAZEPAM 2 MG PO TABS
2.0000 mg | ORAL_TABLET | Freq: Once | ORAL | Status: AC
  Administered 2017-06-07: 2 mg via ORAL
  Filled 2017-06-07: qty 1

## 2017-06-07 MED ORDER — PALIPERIDONE PALMITATE 234 MG/1.5ML IM SUSP
234.0000 mg | INTRAMUSCULAR | Status: DC
  Administered 2017-06-07: 234 mg via INTRAMUSCULAR
  Filled 2017-06-07 (×2): qty 1.5

## 2017-06-07 NOTE — Plan of Care (Signed)
Problem: Coping: Goal: Ability to demonstrate self-control will improve Outcome: Not Progressing Patient was thought/demonstarated how to use copping skills but he refused to use saying " it doesn't work"

## 2017-06-07 NOTE — Progress Notes (Signed)
Patient in bed asleep first part of shift.  Gets up and presents to nurses station requesting nicotine patch.  As pulling medication for patient, patient states "Give whatever you can"  Informed that he was getting his nicotine patch and Depakote.  Patient asked "No ativan" Informed that he had just awaken and no he would not be receiving any ativan at this time.
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
## 2017-06-07 NOTE — Progress Notes (Signed)
D: Patient is alert and oriented x 4, denies SI/HI/AVH, affect is blunted, appears less anxious, interacting with peers and staff appropriately. Patient's thoughts are organized. Patient was offered support and encouragement. Pt was given scheduled medications and  was encouraged to attend groups. Q 15 minute checks were done for safety.  R:Pt attends evening groups and interacts appropriately with peers. Pt is complaint with medication. Pt is not receptive to treatment on the unit. 15 minutes safety rounds maintained on unit, will continue to monitor.

## 2017-06-07 NOTE — Plan of Care (Signed)
Problem: Coping: Goal: Ability to demonstrate self-control will improve Outcome: Not Progressing Relies on medication to deal with stress and anxiety.

## 2017-06-08 MED ORDER — OLANZAPINE 10 MG IM SOLR: 10.0000 mg | Freq: Every day | INTRAMUSCULAR | Status: DC

## 2017-06-08 MED ORDER — PALIPERIDONE PALMITATE 156 MG/ML IM SUSP
156.0000 mg | Freq: Once | INTRAMUSCULAR | Status: AC
Start: 2017-06-11 — End: 2017-06-11
  Administered 2017-06-11: 156 mg via INTRAMUSCULAR
  Filled 2017-06-08: qty 1

## 2017-06-08 MED ORDER — PALIPERIDONE PALMITATE 156 MG/ML IM SUSP: 156.0000 mg | Freq: Once | INTRAMUSCULAR | Status: DC

## 2017-06-08 MED ORDER — OLANZAPINE 10 MG PO TABS
20.0000 mg | ORAL_TABLET | Freq: Every day | ORAL | Status: DC
  Administered 2017-06-09 – 2017-06-10 (×2): 20 mg via ORAL
  Filled 2017-06-08 (×2): qty 2

## 2017-06-08 MED ORDER — OLANZAPINE 10 MG PO TABS
20.0000 mg | ORAL_TABLET | Freq: Every day | ORAL | Status: AC
  Administered 2017-06-08: 20 mg via ORAL
  Filled 2017-06-08: qty 2

## 2017-06-08 NOTE — [facility] Group Notes (Signed)
[facility] LCSW Group Therapy Note  Date/Time: 06/08/17, 0930  Type of Therapy and Topic:  Group Therapy:  Feelings around Relapse and Recovery  Participation Level:  Did Not Attend   Mood:  Description of Group:    Patients in this group will discuss emotions they experience before and after a relapse. They will process how experiencing these feelings, or avoidance of experiencing them, relates to having a relapse. Facilitator will guide patients to explore emotions they have related to recovery. Patients will be encouraged to process which emotions are more powerful. They will be guided to discuss the emotional reaction significant others in their lives may have to patients' relapse or recovery. Patients will be assisted in exploring ways to respond to the emotions of others without this contributing to a relapse.  Therapeutic Goals: 1. Patient will identify two or more emotions that lead to relapse for them:  2. Patient will identify two emotions that result when they relapse:  3. Patient will identify two emotions related to recovery:  4. Patient will demonstrate ability to communicate their needs through discussion and/or role plays.   Summary of Patient Progress:     Therapeutic Modalities:   Cognitive Behavioral Therapy Solution-Focused Therapy Assertiveness Training Relapse Prevention Therapy  Daleen Squibb, LCSW

## 2017-06-08 NOTE — Plan of Care (Signed)
Problem: Activity: Goal: Interest or engagement in activities will improve Outcome: Progressing Pt encouraged to get up and join activities Goal: Sleeping patterns will improve Outcome: Progressing Patient has slept most of the day  Problem: Education: Goal: Emotional status will improve Outcome: Progressing Patient has shown no outburst or signs of sadness

## 2017-06-08 NOTE — Progress Notes (Signed)
Patient denies si, hi, avh. Denies pain. However, he does come up and request an ativan shot. When asked why he needed a shot. He stated cause I'm tired of being here and I'm anxious. Prn  Thorazine given. He is pleasant and smiling and carrying  On a conversation with nurse. He has been med compliant today. He did sleep til around 230  He got up and ate supper.  He is out on the unit but keeps to himself.
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
## 2017-06-08 NOTE — [facility] Group Notes (Signed)
[facility] Group Notes:  (Nursing/MHT/Case Management/Adjunct)  Date:  06/08/2017  Time:  12:46 AM  Type of Therapy:  Evening Wrap-up Group  Participation Level:  Active  Participation Quality:  Appropriate and Attentive  Affect:  Appropriate  Cognitive:  Alert and Appropriate  Insight:  Appropriate  Engagement in Group:  Developing/Improving and Engaged  Modes of Intervention:  Discussion  Summary of Progress/Problems:  Brandon Dodson 06/08/2017, 12:46 AM

## 2017-06-09 NOTE — Plan of Care (Signed)
Problem: Safety: Goal: Periods of time without injury will increase Outcome: Progressing Pt remains safe while in hospital injury free.    

## 2017-06-09 NOTE — Progress Notes (Signed)
Patient ID: Brandon Dodson, male   DOB: 1980-11-30, 36 y.o.   MRN: [id number] LCSW Group Therapy Note  06/09/2017 1:00pm  Type of Therapy and Topic:  Group Therapy:  Cognitive Distortions  Participation Level:  Did Not Attend   Description of Group:    Patients in this group will be introduced to the topic of cognitive distortions.  Patients will identify and describe cognitive distortions, describe the feelings these distortions create for them.  Patients will identify one or more situations in their personal life where they have cognitively distorted thinking and will verbalize challenging this cognitive distortion through positive thinking skills.  Patients will practice the skill of using positive affirmations to challenge cognitive distortions using affirmation cards.    Therapeutic Goals:  1. Patient will identify two or more cognitive distortions they have used 2. Patient will identify one or more emotions that stem from use of a cognitive distortion 3. Patient will demonstrate use of a positive affirmation to counter a cognitive distortion through discussion and/or role play. 4. Patient will describe one way cognitive distortions can be detrimental to wellness   Summary of Patient Progress:     Therapeutic Modalities:   Cognitive Behavioral Therapy Motivational Interviewing   Glennon Mac, LCSW 06/09/2017 3:52 PM

## 2017-06-09 NOTE — Progress Notes (Signed)
PT stated, " I'm feeling on edge and agitated I need some medicine". Pt given ativan and haldol for agitation. Will continue to monitor.
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
## 2017-06-09 NOTE — Progress Notes (Signed)
Pt appears to be resting in bed he does not wake up for staff at this time. Respirations WNL no distress noted. Will continue to monitor for safety.

## 2017-06-09 NOTE — [facility] Group Notes (Signed)
[facility] Group Notes:  (Nursing/MHT/Case Management/Adjunct)  Date:  06/09/2017  Time:  1:04 AM  Type of Therapy:  Psychoeducational Skills  Participation Level:  Active  Participation Quality:  Appropriate, Attentive and Sharing  Affect:  Appropriate  Cognitive:  Appropriate  Insight:  Appropriate and Good  Engagement in Group:  Engaged  Modes of Intervention:  Discussion, Socialization and Support  Summary of Progress/Problems:  Brandon Dodson 06/09/2017, 1:04 AM

## 2017-06-09 NOTE — Progress Notes (Signed)
Patient did not wake when multiple attempts by multiple staff were tried.  Breathing was normal and regular at 16/minute.  Patient was not given Depakote 500 mg due at 0600.

## 2017-06-09 NOTE — Plan of Care (Signed)
Problem: Coping: Goal: Ability to verbalize frustrations and anger appropriately will improve Outcome: Progressing Patient was observed sitting in the dayroom with peers and joining in making jokes.  He was friendly on approach but focused on medications at each encounter with Clinical research associate.  He displayed no anger/frustrations thus far.  Problem: Physical Regulation: Goal: Ability to maintain clinical measurements within normal limits will improve Outcome: Progressing Patient was observed with peers and while waiting in line for medications.  He had good eye contact and content was assertive.  He was calm and pleasant this evening.

## 2017-06-10 LAB — VALPROIC ACID LEVEL: Valproic Acid Lvl: 92 ug/mL (ref 50.0–100.0)

## 2017-06-10 MED ORDER — PALIPERIDONE PALMITATE 234 MG/1.5ML IM SUSP: 234.0000 mg | INTRAMUSCULAR | 1 refills | Status: DC

## 2017-06-10 MED ORDER — DIVALPROEX SODIUM 250 MG PO DR TAB: 750.0000 mg | DELAYED_RELEASE_TABLET | Freq: Two times a day (BID) | ORAL | 1 refills | Status: DC

## 2017-06-10 MED ORDER — DIVALPROEX SODIUM 500 MG PO DR TAB
750.0000 mg | DELAYED_RELEASE_TABLET | Freq: Two times a day (BID) | ORAL | Status: DC
  Administered 2017-06-10 – 2017-06-11 (×2): 750 mg via ORAL
  Filled 2017-06-10 (×2): qty 1

## 2017-06-10 MED ORDER — LORAZEPAM 1 MG PO TABS
1.0000 mg | ORAL_TABLET | Freq: Four times a day (QID) | ORAL | Status: DC | PRN
  Administered 2017-06-10 (×2): 1 mg via ORAL
  Filled 2017-06-10 (×3): qty 1

## 2017-06-10 MED ORDER — OLANZAPINE 20 MG PO TABS: 20.0000 mg | ORAL_TABLET | Freq: Every day | ORAL | 1 refills | Status: DC

## 2017-06-10 MED ORDER — TEMAZEPAM 15 MG PO CAPS: 15.0000 mg | ORAL_CAPSULE | Freq: Every day | ORAL | 0 refills | Status: DC

## 2017-06-10 NOTE — Plan of Care (Signed)
Problem: Activity: Goal: Interest or engagement in activities will improve Outcome: Not Progressing Patient remains isolative to room at this time. Minimal peer interaction noted. Goal: Sleeping patterns will improve Outcome: Not Progressing Patient sleeps majority of the day.  Problem: Education: Goal: Knowledge of [facility] Education information/materials will improve Outcome: Progressing Patient acknowledges understanding the information provided.  Problem: Coping: Goal: Ability to demonstrate self-control will improve Outcome: Progressing Patient maintains self control.  Problem: Safety: Goal: Ability to remain free from injury will improve Outcome: Progressing Patient remains free from injury at this time.

## 2017-06-10 NOTE — Plan of Care (Signed)
Problem: Coping: Goal: Ability to verbalize frustrations and anger appropriately will improve Outcome: Progressing Patient talked about discharge plans and states he is hoping it will be tomorrow.  He took prn medications as ordered.  He was calm and pleasant this evening.  Problem: Safety: Goal: Periods of time without injury will increase Outcome: Progressing Patient continues to avoid the group of peers but is friendly on approach.  He contracts for safety and denies SI/HI/AV/H.

## 2017-06-10 NOTE — Progress Notes (Signed)
Patient in bed all morning, resting with eyes closed. Patient stated that he did not want to eat breakfast but he did however get up for lunch. Patient denies SI at this time. Patient seen in the community this afternoon, ambulating with a steady gait. Selective peer interaction shown. Will continue to monitor for safety.

## 2017-06-10 NOTE — Plan of Care (Signed)
Problem: Coping: Goal: Ability to verbalize frustrations and anger appropriately will improve Outcome: Not Progressing Patient is polite and cooperative but does not engage in conversation with staff except about medications.  He spent brief periods in the dayroom but avoids group activity. He does chat superficially with peers while waiting for medications.  Problem: Safety: Goal: Periods of time without injury will increase Outcome: Progressing Patient denies SI/HI/AV/H and contracts for safety.
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
## 2017-06-10 NOTE — [facility] Group Notes (Signed)
[facility] LCSW Group Therapy 06/10/2017 1:15pm  Type of Therapy: Group Therapy- Feelings Around Discharge & Establishing a Supportive Framework  Participation Level:  Did Not Attend  Description of Group:   What is a supportive framework? What does it look like feel like and how do I discern it from and unhealthy non-supportive network? Learn how to cope when supports are not helpful and don't support you. Discuss what to do when your family/friends are not supportive.   Therapeutic Modalities:   Cognitive Behavioral Therapy Person-Centered Therapy Motivational Interviewing   Verdene Lennert, LCSW 06/10/2017 2:36 PM

## 2017-06-11 DIAGNOSIS — F312 Bipolar disorder, current episode manic severe with psychotic features: Principal | ICD-10-CM

## 2017-06-11 MED ORDER — OLANZAPINE 20 MG PO TABS: 20.0000 mg | ORAL_TABLET | Freq: Every day | ORAL | 1 refills | Status: DC

## 2017-06-11 MED ORDER — DIVALPROEX SODIUM 250 MG PO DR TAB: 750.0000 mg | DELAYED_RELEASE_TABLET | Freq: Two times a day (BID) | ORAL | 1 refills | Status: DC

## 2017-06-11 MED ORDER — TEMAZEPAM 15 MG PO CAPS: 15.0000 mg | ORAL_CAPSULE | Freq: Every day | ORAL | 0 refills | Status: DC

## 2017-06-11 MED ORDER — PALIPERIDONE PALMITATE 234 MG/1.5ML IM SUSP: 234.0000 mg | INTRAMUSCULAR | 1 refills | Status: DC

## 2017-06-11 NOTE — [facility] Suicide Risk Assessment (Signed)
[facility] Discharge Suicide Risk Assessment   Principal Problem: Bipolar I disorder, current or most recent episode manic, with psychotic features [facility]) Discharge Diagnoses:  Patient Active Problem List   Diagnosis Date Noted  . Bipolar I disorder, current or most recent episode manic, with psychotic features (HCC) [F31.2] 05/31/2017  . Tobacco use disorder [F17.200] 05/31/2017    Total Time spent with patient: 30 minutes  Musculoskeletal: Strength & Muscle Tone: within normal limits Gait & Station: normal Patient leans: N/A  Psychiatric Specialty Exam: Review of Systems  Constitutional: Negative.   HENT: Negative.   Eyes: Negative.   Respiratory: Negative.   Cardiovascular: Negative.   Gastrointestinal: Negative.   Genitourinary: Negative.   Musculoskeletal: Negative.   Skin: Negative.   Neurological: Negative.   Endo/Heme/Allergies: Negative.     Blood pressure 105/72, pulse 85, temperature 97.8 F (36.6 C), temperature source Oral, resp. rate 18, height  (1.778 m), weight 78 kg (172 lb), SpO2 99 %.Body mass index is 24.68 kg/m.  General Appearance: Casual  Eye Contact::  Good  Speech:  Clear and Coherent409  Volume:  Normal  Mood:  Euthymic  Affect:  Appropriate  Thought Process:  Goal Directed and Descriptions of Associations: Intact  Orientation:  Full (Time, Place, and Person)  Thought Content:  WDL  Suicidal Thoughts:  No  Homicidal Thoughts:  No  Memory:  Immediate;   Fair Recent;   Fair Remote;   Fair  Judgement:  Poor  Insight:  Shallow  Psychomotor Activity:  Normal  Concentration:  Fair  Recall:  [employer] of Knowledge:Fair  Language: Fair  Akathisia:  No  Handed:  Right  AIMS (if indicated):     Assets:  Communication Skills Desire for Improvement Financial Resources/Insurance Housing Intimacy Physical Health Resilience Social Support Transportation  Sleep:  Number of Hours: 5.45  Cognition: WNL  ADL's:  Intact   Mental Status Per  Nursing Assessment::   On Admission:     Demographic Factors:  Male, Adolescent or young adult, Caucasian, Low socioeconomic status and Unemployed  Loss Factors: Legal issues and Financial problems/change in socioeconomic status  Historical Factors: Family history of mental illness or substance abuse and Impulsivity  Risk Reduction Factors:   Sense of responsibility to family, Religious beliefs about death, Living with another person, especially a relative and Positive social support  Continued Clinical Symptoms:  Bipolar Disorder:   Mixed State  Cognitive Features That Contribute To Risk:  None    Suicide Risk:  Minimal: No identifiable suicidal ideation.  Patients presenting with no risk factors but with morbid ruminations; may be classified as minimal risk based on the severity of the depressive symptoms  Follow-up Information    [facility]. Go on 07/03/2017.   Why:  Please attend your medication appointment with Dr Percell Belt on Tuesday, 07/03/17, at 1:45pm.  Please bring photo ID and your hospital discharge paperwork.  If you miss this appointment, you will not be able to reschedule.  Patient declined therapist appt. Contact information: [address] [place] [address] [phone]           Plan Of Care/Follow-up recommendations:  Activity:  as tolerated. Diet:  regular. Other:  keep follow up appointments.  Kristine Linea, MD 06/11/2017, 8:01 AM

## 2017-06-11 NOTE — Progress Notes (Addendum)
  [facility] Adult Case Management Discharge Plan :  Will you be returning to the same living situation after discharge:  Yes,  with girlfriend At discharge, do you have transportation home?: Yes,  girlfriend Do you have the ability to pay for your medications: Yes,  [employer]  Release of information consent forms completed and in the chart;  Patient's signature needed at discharge.  Patient to Follow up at: Follow-up Information    [facility], [place]. Go on 07/03/2017.   Why:  Please attend your medication appointment with Dr Percell Belt on Tuesday, 07/03/17, at 1:45pm.  Please bring photo ID and your hospital discharge paperwork.  If you miss this appointment, you will not be able to reschedule.  Patient declined therapist appt. Contact information: [address] [place] [address] [phone]           Next level of care provider has access to [facility] Link:no  Safety Planning and Suicide Prevention discussed: No. Pt refused consent.  Have you used any form of tobacco in the last 30 days? (Cigarettes, Smokeless Tobacco, Cigars, and/or Pipes): Yes  Has patient been referred to the Quitline?: Patient refused referral  Patient has been referred for addiction treatment: Yes   Pt has a court date in the state of [place] on 06/15/17 and will be traveling there after discharge.  Uncertain as to exact time of return.  No seven day appt made.    Lorri Frederick, LCSW 06/11/2017, 10:18 AM

## 2017-06-11 NOTE — Progress Notes (Addendum)
CSW spoke to [facility], pt girlfriend, regarding possible discharge today.  She is still not feeling comfortable with him returning.  She asked that he remain in the hospital for 2 more days to "make sure".  She has spoken to him by phone every day and she is not convinced that all of his thoughts are back to normal. Garner Nash, MSW, LCSW Clinical Social Worker 06/11/2017 9:40 AM   CSW spoke with Dr Demetrius Charity who reports pt IVC expires tomorrow and pt is stable for discharge, which cannot be delayed longer.  If Rosita remains uncomfortable, pt has mentioned possibly staying with his sister.  CSW spoke again with Rosita and conveyed the above message from Dr Demetrius Charity.  Rosita said that pt sister is unwilling to take him either.  CSW told her that she does not need to allow him back if she feels unsafe but Rosita said that all pt's friends use drugs and this would be a bad environment for him as well.  She requests that pt be placed under outpt commitment so that she will know he is still getting his injection.   Garner Nash, MSW, LCSW Clinical Social Worker 06/11/2017 10:04 AM

## 2017-06-11 NOTE — Progress Notes (Signed)
Patient up on the unit. He is quiet but speaks to staff. He denies si, hi, avh. Denies pain. Dc instructions given he understands and he is walked out.
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
## 2017-06-11 NOTE — [facility] Group Notes (Signed)
[facility] Group Notes:  (Nursing/MHT/Case Management/Adjunct)  Date:  06/11/2017  Time:  10:14 AM  Type of Therapy:  Psychoeducational Skills  Participation Level:  Did Not Attend  Lynelle Smoke [facility] 06/11/2017, 10:14 AM

## 2018-02-14 ENCOUNTER — Encounter: Payer: Self-pay | Admitting: *Deleted

## 2018-02-14 ENCOUNTER — Emergency Department
Admission: EM | Admit: 2018-02-14 | Discharge: 2018-02-14 | Disposition: A | Discharge status: Home / Self Care | Payer: PRIVATE HEALTH INSURANCE | Attending: Emergency Medicine | Admitting: Emergency Medicine

## 2018-02-14 ENCOUNTER — Other Ambulatory Visit: Payer: Self-pay

## 2018-02-14 DIAGNOSIS — Z91030 Bee allergy status: Secondary | ICD-10-CM

## 2018-02-14 DIAGNOSIS — R51 Headache: Secondary | ICD-10-CM | POA: Insufficient documentation

## 2018-02-14 DIAGNOSIS — Z79899 Other long term (current) drug therapy: Secondary | ICD-10-CM | POA: Insufficient documentation

## 2018-02-14 DIAGNOSIS — R55 Syncope and collapse: Secondary | ICD-10-CM

## 2018-02-14 DIAGNOSIS — F17210 Nicotine dependence, cigarettes, uncomplicated: Secondary | ICD-10-CM | POA: Insufficient documentation

## 2018-02-14 LAB — BASIC METABOLIC PANEL
ANION GAP: 8 (ref 5–15)
BUN: 15 mg/dL (ref 6–20)
CALCIUM: 8.9 mg/dL (ref 8.9–10.3)
CO2: 26 mmol/L (ref 22–32)
Chloride: 103 mmol/L (ref 101–111)
Creatinine, Ser: 1.12 mg/dL (ref 0.61–1.24)
GFR calc Af Amer: >60 mL/min (ref >60)
GFR calc non Af Amer: >60 mL/min (ref >60)
Glucose, Bld: 84 mg/dL (ref 65–99)
Potassium: 4.4 mmol/L (ref 3.5–5.1)
Sodium: 137 mmol/L (ref 135–145)

## 2018-02-14 LAB — CBC
HCT: 40.6 % (ref 40.0–52.0)
HEMOGLOBIN: 14.2 g/dL (ref 13.0–18.0)
MCH: 30.6 pg (ref 26.0–34.0)
MCHC: 35.1 g/dL (ref 32.0–36.0)
MCV: 87.2 fL (ref 80.0–100.0)
Platelets: 174 10*3/uL (ref 150–440)
RBC: 4.65 MIL/uL (ref 4.40–5.90)
RDW: 12.7 % (ref 11.5–14.5)
WBC: 7.7 10*3/uL (ref 3.8–10.6)

## 2018-02-14 LAB — URINALYSIS, COMPLETE (UACMP) WITH MICROSCOPIC
Bacteria, UA: NONE SEEN
Bilirubin Urine: NEGATIVE
GLUCOSE, UA: NEGATIVE mg/dL
HGB URINE DIPSTICK: NEGATIVE
Ketones, ur: 5 mg/dL — AB
Leukocytes, UA: NEGATIVE
Nitrite: NEGATIVE
PROTEIN: NEGATIVE mg/dL
Specific Gravity, Urine: 1.026 (ref 1.005–1.030)
Squamous Epithelial / LPF: NONE SEEN (ref 0–5)
WBC UA: NONE SEEN WBC/hpf (ref 0–5)
pH: 5 (ref 5.0–8.0)

## 2018-02-14 LAB — TROPONIN I: Troponin I: <0.03 ng/mL (ref <0.03)

## 2018-02-14 MED ORDER — EPINEPHRINE 0.3 MG/0.3ML IJ SOAJ: 0.3000 mg | Freq: Once | INTRAMUSCULAR | 0 refills | Status: AC

## 2018-02-14 NOTE — ED Triage Notes (Signed)
Pt states while at work tonight he became lightheaded and started sweating.  No n/v/d. Pt has headache.  Pt alert  Speech clear,

## 2018-02-14 NOTE — Discharge Instructions (Addendum)
Your workup including labs and EKG show reassuring results.  Your symptoms may be due to dehydration, so it is important that you drink plenty of non-alcoholic fluids.  Please call your regular doctor as soon as possible to schedule the next available clinic appointment to follow up with him/her regarding your visit to the ED and your symptoms.  Return to the Emergency Department (ED)  if you have any further syncopal episodes (pass out again) or develop ANY chest pain, pressure, tightness, trouble breathing, sudden sweating, or other symptoms that concern you.

## 2018-02-14 NOTE — ED Notes (Signed)

## 2018-02-14 NOTE — ED Provider Notes (Signed)
[facility] Emergency Department Provider Note   ____________________________________________   First MD Initiated Contact with Patient 02/14/18 2222     (approximate)  I have reviewed the triage vital signs and the nursing notes.   HISTORY  Chief Complaint Dizziness    HPI Brandon Dodson is a 37 y.o. male reports became lightheaded today while at work.  Patient reports he went to work, he stands in one spot and is hot in [employer] facility he works.  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  He began feeling lightheaded, as though he is going to pass out, and started to sweat.  He was able to get to a chair and after sitting the chair for about 10 minutes his symptoms improved.  He decided to go home as he felt he might be slightly dehydrated.  Reports he is never had the symptoms in the past.  He denies any chest pain or trouble breathing.  No fevers or chills.  No history of any heart problems  Reports that he is been doing well, but does not know what happened earlier.  Reports full recovery at this time.  No stings, no redness, no fevers or chills.  No pain, he did have a mild headache and some slight sweatiness after the symptoms started but this recovered after sitting the chair for about 10 minutes.  Reports he feels fine now.  He does note that he did not have anything to eat or drink before going into work today.  No past medical history on file.  Patient Active Problem List   Diagnosis Date Noted  . Bipolar I disorder, current or most recent episode manic, with psychotic features (HCC) 05/31/2017  . Tobacco use disorder 05/31/2017    Past Surgical History:  Procedure Laterality Date  . DENTAL SURGERY      Prior to Admission medications   Medication Sig Start Date End Date Taking? Authorizing Provider  divalproex (DEPAKOTE) 250 MG DR tablet Take 3 tablets (750  mg total) by mouth every 12 (twelve) hours. 06/11/17   [name], [name], MD  OLANZapine (ZYPREXA) 20 MG tablet Take 1 tablet (20 mg total) by mouth at bedtime. 06/11/17   [name], [name], MD  paliperidone (INVEGA SUSTENNA) 234 MG/1.5ML SUSP injection Inject 234 mg into the muscle every 28 (twenty-eight) days. Next injection on 07/05/2017. 07/05/17   [name], [name], MD  temazepam (RESTORIL) 15 MG capsule Take 1 capsule (15 mg total) by mouth at bedtime. 06/11/17   [name], Ellin Goodie, MD    Allergies Patient has no known allergies.  No family history on file.  Social History Social History   Tobacco Use  . Smoking status: Current Every Day Smoker    Packs/day: 1.00    Types: Cigarettes  . Smokeless tobacco: Never Used  Substance Use Topics  . Alcohol use: Yes  . Drug use: Not on file    Review of Systems Constitutional: No fever/chills Eyes: No visual changes. ENT: No sore throat. Cardiovascular: Denies chest pain. Respiratory: Denies shortness of breath. Gastrointestinal: No abdominal pain.  No nausea, no vomiting.  No diarrhea.  No constipation. Genitourinary: Negative for dysuria. Musculoskeletal: Negative for back pain. Skin: Negative for rash.  Did get sweaty during the episode. Neurological: Negative for focal weakness or numbness.  No trouble speaking.    ____________________________________________   PHYSICAL EXAM:  VITAL SIGNS: ED Triage Vitals  Enc Vitals Group  BP 02/14/18 2113 122/61     Pulse Rate 02/14/18 2113 69     Resp 02/14/18 2113 20     Temp 02/14/18 2113 98.8 F (37.1 C)     Temp Source 02/14/18 2113 Oral     SpO2 02/14/18 2113 97 %     Weight 02/14/18 2111 190 lb (86.2 kg)     Height 02/14/18 2111  (1.778 m)     Head Circumference --      Peak Flow --      Pain Score 02/14/18 2111 4     Pain Loc --      Pain Edu? --      Excl. in GC? --     Constitutional: Alert and oriented. Well appearing and in no acute  distress. Eyes: Conjunctivae are normal. Head: Atraumatic. Nose: No congestion/rhinnorhea. Mouth/Throat: Mucous membranes are moist. Neck: No stridor.   Cardiovascular: Normal rate, regular rhythm. Grossly normal heart sounds.  Good peripheral circulation. Respiratory: Normal respiratory effort.  No retractions. Lungs CTAB. Gastrointestinal: Soft and nontender. No distention. Musculoskeletal: No lower extremity tenderness nor edema. Neurologic:  Normal speech and language. No gross focal neurologic deficits are appreciated.  Normal extraocular movements.  No pronator drift.  Equal and symmetric grip and smile bilateral.  Normal strength in all extremities.  Normal gait. Skin:  Skin is warm, dry and intact. No rash noted. Psychiatric: Mood and affect are normal. Speech and behavior are normal.  ____________________________________________   LABS (all labs ordered are listed, but only abnormal results are displayed)  Labs Reviewed  URINALYSIS, COMPLETE (UACMP) WITH MICROSCOPIC - Abnormal; Notable for the following components:      Result Value   Color, Urine YELLOW (*)    APPearance CLEAR (*)    Ketones, ur 5 (*)    All other components within normal limits  BASIC METABOLIC PANEL  CBC  TROPONIN I   ____________________________________________  EKG  ED ECG REPORT I, Sharyn Creamer, the attending physician, personally viewed and interpreted this ECG.  Date: 02/14/2018 EKG Time: 2115 Rate: 70 Rhythm: normal sinus rhythm QRS Axis: normal Intervals: normal ST/T Wave abnormalities: normal Narrative Interpretation: no evidence of acute ischemia, no prolonged QT, no Brugada, no WPW  ____________________________________________  RADIOLOGY  No indication for imaging denoted.  Patient reports asymptomatic at this time.  Assuring clinical examination ____________________________________________   PROCEDURES  Procedure(s) performed: None  Procedures  Critical Care performed:  No  ____________________________________________   INITIAL IMPRESSION / ASSESSMENT AND PLAN / ED COURSE  Pertinent labs & imaging results that were available during my care of the patient were reviewed by me and considered in my medical decision making (see chart for details).  Patient returns for evaluation of near syncopal episode.  This occurred while at work, he had not hydrated before, was standing in one spot for about 15 minutes working in Set [employer], began feeling very lightheaded, then had a near syncopal episode.  He did not pass out.  He had no associated neurologic cardiac or acute pulmonary symptoms.  His symptoms have resolved at this time.  Reports they last until about 10 to 15 minutes.  Patient also has a history of bee sting allergy and reports his EpiPen is out of date.  He is not stung by any insects and did not have any allergic reaction today, but would like a refill on his EpiPen as it is expired.  I will provide this.  Patient's EKG, blood work and clinical history and  exam very reassuring.  Normal examination.  No neurologic symptoms.  Headache and all symptoms are completely resolved.  Suspect likely orthostatic hypotension leading to near syncopal episode possibly vasovagal today.  Reassuring cardiac and blood work.  Return precautions and treatment recommendations and follow-up discussed with the patient who is agreeable with the plan.  Clinical exam currently euvolemic.      ____________________________________________   FINAL CLINICAL IMPRESSION(S) / ED DIAGNOSES  Final diagnoses:  Near syncope  Bee sting allergy      NEW MEDICATIONS STARTED DURING THIS VISIT:  Discharge Medication List as of 02/14/2018 10:35 PM    START taking these medications   Details  EPINEPHrine 0.3 mg/0.3 mL IJ SOAJ injection Inject 0.3 mLs (0.3 mg total) into the muscle once for 1 dose., Starting Thu 02/14/2018, Print         Note:  This document was prepared using  Dragon voice recognition software and may include unintentional dictation errors.     Sharyn Creamer, MD 02/15/18 [id number]

## 2018-06-17 ENCOUNTER — Encounter: Payer: Self-pay | Admitting: Emergency Medicine

## 2018-06-17 ENCOUNTER — Other Ambulatory Visit: Payer: Self-pay

## 2018-06-17 ENCOUNTER — Emergency Department
Admission: EM | Admit: 2018-06-17 | Discharge: 2018-06-18 | Disposition: A | Discharge status: Other Acute Inpatient Hospital | Payer: PRIVATE HEALTH INSURANCE | Attending: Emergency Medicine | Admitting: Emergency Medicine

## 2018-06-17 DIAGNOSIS — F312 Bipolar disorder, current episode manic severe with psychotic features: Secondary | ICD-10-CM | POA: Diagnosis present

## 2018-06-17 DIAGNOSIS — F319 Bipolar disorder, unspecified: Secondary | ICD-10-CM | POA: Insufficient documentation

## 2018-06-17 DIAGNOSIS — F17210 Nicotine dependence, cigarettes, uncomplicated: Secondary | ICD-10-CM | POA: Insufficient documentation

## 2018-06-17 DIAGNOSIS — F29 Unspecified psychosis not due to a substance or known physiological condition: Secondary | ICD-10-CM

## 2018-06-17 DIAGNOSIS — R4689 Other symptoms and signs involving appearance and behavior: Secondary | ICD-10-CM | POA: Diagnosis present

## 2018-06-17 DIAGNOSIS — R45850 Homicidal ideations: Secondary | ICD-10-CM | POA: Diagnosis not present

## 2018-06-17 DIAGNOSIS — Z79899 Other long term (current) drug therapy: Secondary | ICD-10-CM | POA: Insufficient documentation

## 2018-06-17 LAB — COMPREHENSIVE METABOLIC PANEL
ALT: 18 U/L (ref 0–44)
ANION GAP: 12 (ref 5–15)
AST: 24 U/L (ref 15–41)
Albumin: 4.2 g/dL (ref 3.5–5.0)
Alkaline Phosphatase: 76 U/L (ref 38–126)
BILIRUBIN TOTAL: 0.6 mg/dL (ref 0.3–1.2)
BUN: 14 mg/dL (ref 6–20)
CHLORIDE: 102 mmol/L (ref 98–111)
CO2: 26 mmol/L (ref 22–32)
Calcium: 9 mg/dL (ref 8.9–10.3)
Creatinine, Ser: 1.13 mg/dL (ref 0.61–1.24)
GFR calc Af Amer: >60 mL/min (ref >60)
Glucose, Bld: 97 mg/dL (ref 70–99)
Potassium: 3.8 mmol/L (ref 3.5–5.1)
Sodium: 140 mmol/L (ref 135–145)
TOTAL PROTEIN: 7.3 g/dL (ref 6.5–8.1)

## 2018-06-17 LAB — CBC WITH DIFFERENTIAL/PLATELET
BASOS ABS: 0.1 10*3/uL (ref 0–0.1)
BASOS PCT: 1 %
Eosinophils Absolute: 0.2 10*3/uL (ref 0–0.7)
Eosinophils Relative: 2 %
HEMATOCRIT: 43.2 % (ref 40.0–52.0)
HEMOGLOBIN: 15.5 g/dL (ref 13.0–18.0)
Lymphocytes Relative: 30 %
Lymphs Abs: 2.5 10*3/uL (ref 1.0–3.6)
MCH: 32 pg (ref 26.0–34.0)
MCHC: 35.9 g/dL (ref 32.0–36.0)
MCV: 89.2 fL (ref 80.0–100.0)
Monocytes Absolute: 0.6 10*3/uL (ref 0.2–1.0)
Monocytes Relative: 8 %
NEUTROS ABS: 4.9 10*3/uL (ref 1.4–6.5)
NEUTROS PCT: 59 %
Platelets: 201 10*3/uL (ref 150–440)
RBC: 4.85 MIL/uL (ref 4.40–5.90)
RDW: 13.4 % (ref 11.5–14.5)
WBC: 8.2 10*3/uL (ref 3.8–10.6)

## 2018-06-17 LAB — ETHANOL

## 2018-06-17 MED ORDER — OLANZAPINE 10 MG PO TABS
20.0000 mg | ORAL_TABLET | Freq: Every day | ORAL | Status: DC
  Administered 2018-06-17: 20 mg via ORAL
  Filled 2018-06-17: qty 2
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
## 2018-06-17 NOTE — ED Notes (Signed)
Hourly rounding reveals patient in room awaiting [facility] Psychiatrist. No complaints, stable, in no acute distress. Q15 minute rounds and monitoring via [employer] to continue.
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
## 2018-06-17 NOTE — ED Notes (Signed)
Pt. Transferred to BHU from ED to room 1 after screening for contraband. Report to include Situation, Background, Assessment and Recommendations from Kenisha RN. Pt. Oriented to unit including Q15 minute rounds as well as the security cameras for their protection. Patient is alert and oriented, warm and dry in no acute distress. Patient denies SI, HI, and AVH. Pt. Encouraged to let me know if needs arise. 
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
## 2018-06-18 ENCOUNTER — Inpatient Hospital Stay
Admission: AD | Admit: 2018-06-18 | Discharge: 2018-06-24 | DRG: 885 | Disposition: A | Discharge status: Home / Self Care | Payer: PRIVATE HEALTH INSURANCE | Attending: Psychiatry | Admitting: Psychiatry

## 2018-06-18 ENCOUNTER — Other Ambulatory Visit: Payer: Self-pay

## 2018-06-18 DIAGNOSIS — G8929 Other chronic pain: Secondary | ICD-10-CM | POA: Diagnosis present

## 2018-06-18 DIAGNOSIS — Z9119 Patient's noncompliance with other medical treatment and regimen: Secondary | ICD-10-CM | POA: Diagnosis not present

## 2018-06-18 DIAGNOSIS — F17210 Nicotine dependence, cigarettes, uncomplicated: Secondary | ICD-10-CM | POA: Diagnosis present

## 2018-06-18 DIAGNOSIS — F312 Bipolar disorder, current episode manic severe with psychotic features: Secondary | ICD-10-CM | POA: Diagnosis present

## 2018-06-18 DIAGNOSIS — F17200 Nicotine dependence, unspecified, uncomplicated: Secondary | ICD-10-CM | POA: Diagnosis present

## 2018-06-18 DIAGNOSIS — Z818 Family history of other mental and behavioral disorders: Secondary | ICD-10-CM | POA: Diagnosis not present

## 2018-06-18 DIAGNOSIS — F1121 Opioid dependence, in remission: Secondary | ICD-10-CM | POA: Diagnosis present

## 2018-06-18 DIAGNOSIS — Z5181 Encounter for therapeutic drug level monitoring: Secondary | ICD-10-CM | POA: Diagnosis not present

## 2018-06-18 LAB — ACETAMINOPHEN LEVEL: Acetaminophen (Tylenol), Serum: <10 ug/mL — ABNORMAL LOW (ref 10–30)

## 2018-06-18 LAB — SALICYLATE LEVEL: Salicylate Lvl: <7 mg/dL (ref 2.8–30.0)

## 2018-06-18 MED ORDER — HYDROXYZINE HCL 50 MG PO TABS: 50.0000 mg | ORAL_TABLET | Freq: Three times a day (TID) | ORAL | Status: DC | PRN

## 2018-06-18 MED ORDER — BUPRENORPHINE HCL-NALOXONE HCL 8-2 MG SL SUBL
1.0000 | SUBLINGUAL_TABLET | Freq: Two times a day (BID) | SUBLINGUAL | Status: DC
  Administered 2018-06-18 – 2018-06-24 (×12): 1 via SUBLINGUAL
  Filled 2018-06-18 (×12): qty 1

## 2018-06-18 MED ORDER — NICOTINE 21 MG/24HR TD PT24
21.0000 mg | MEDICATED_PATCH | Freq: Every day | TRANSDERMAL | Status: DC
  Filled 2018-06-18 (×2): qty 1

## 2018-06-18 MED ORDER — LORAZEPAM 1 MG PO TABS: 1.0000 mg | ORAL_TABLET | Freq: Every day | ORAL | Status: DC

## 2018-06-18 MED ORDER — OLANZAPINE 10 MG PO TABS: 10.0000 mg | ORAL_TABLET | Freq: Every day | ORAL | Status: DC

## 2018-06-18 MED ORDER — ACETAMINOPHEN 325 MG PO TABS: 650.0000 mg | ORAL_TABLET | Freq: Four times a day (QID) | ORAL | Status: DC | PRN

## 2018-06-18 MED ORDER — OLANZAPINE 10 MG PO TABS
20.0000 mg | ORAL_TABLET | Freq: Every day | ORAL | Status: DC
  Administered 2018-06-18: 20 mg via ORAL
  Filled 2018-06-18: qty 2

## 2018-06-18 MED ORDER — TEMAZEPAM 15 MG PO CAPS
15.0000 mg | ORAL_CAPSULE | Freq: Every day | ORAL | Status: DC
  Administered 2018-06-20 – 2018-06-22 (×3): 15 mg via ORAL
  Filled 2018-06-18 (×3): qty 1

## 2018-06-18 MED ORDER — DIVALPROEX SODIUM 500 MG PO DR TAB
750.0000 mg | DELAYED_RELEASE_TABLET | Freq: Two times a day (BID) | ORAL | Status: DC
  Administered 2018-06-20 – 2018-06-24 (×7): 750 mg via ORAL
  Filled 2018-06-18 (×10): qty 1

## 2018-06-18 MED ORDER — MAGNESIUM HYDROXIDE 400 MG/5ML PO SUSP
30.0000 mL | Freq: Every day | ORAL | Status: DC | PRN
  Administered 2018-06-23: 30 mL via ORAL
  Filled 2018-06-18: qty 30

## 2018-06-18 MED ORDER — DIVALPROEX SODIUM 500 MG PO DR TAB: 750.0000 mg | DELAYED_RELEASE_TABLET | Freq: Two times a day (BID) | ORAL | Status: DC

## 2018-06-18 MED ORDER — PALIPERIDONE PALMITATE ER 234 MG/1.5ML IM SUSY
234.0000 mg | PREFILLED_SYRINGE | INTRAMUSCULAR | Status: DC
  Filled 2018-06-18: qty 1.5

## 2018-06-18 MED ORDER — ALUM & MAG HYDROXIDE-SIMETH 200-200-20 MG/5ML PO SUSP: 30.0000 mL | ORAL | Status: DC | PRN

## 2018-06-18 NOTE — ED Notes (Signed)
Report to include Situation, Background, Assessment, and Recommendations received from Jadeka RN. Patient alert and oriented, warm and dry, in no acute distress. Patient denies SI, HI, AVH and pain. Patient made aware of Q15 minute rounds and security cameras for their safety. Patient instructed to come to me with needs or concerns. 

## 2018-06-18 NOTE — Consult Note (Signed)
  Controlled substance database confirms recent prescriptions of suboxone at 16mg  per day. Orders done.

## 2018-06-18 NOTE — ED Notes (Signed)
Patient refused lunch

## 2018-06-18 NOTE — ED Notes (Signed)
Hourly rounding reveals patient sleeping in room. No complaints, stable, in no acute distress. Q15 minute rounds and monitoring via Security Cameras to continue. 

## 2018-06-18 NOTE — ED Notes (Signed)
IVC/ Pending Admit to BMU  
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
## 2018-06-18 NOTE — Tx Team (Signed)
Initial Treatment Plan 06/18/2018 10:05 PM Brandon Dodson ZOX:[id number]    PATIENT STRESSORS: Financial difficulties Marital or family conflict Occupational concerns   PATIENT STRENGTHS: Average or above average intelligence Capable of independent living Communication skills Motivation for treatment/growth   PATIENT IDENTIFIED PROBLEMS: Patient feels angry    Non compliant with his medications     Paranoia              DISCHARGE CRITERIA:  Adequate post-discharge living arrangements Motivation to continue treatment in a less acute level of care Reduction of life-threatening or endangering symptoms to within safe limits Verbal commitment to aftercare and medication compliance  PRELIMINARY DISCHARGE PLAN: Outpatient therapy Participate in family therapy Return to previous living arrangement  PATIENT/FAMILY INVOLVEMENT: This treatment plan has been presented to and reviewed with the patient, Brandon Dodson,  The patient  have been given the opportunity to ask questions and make suggestions.  Lelan Pons, RN 06/18/2018, 10:05 PM

## 2018-06-18 NOTE — ED Notes (Signed)
Patient is eating dinner

## 2018-06-18 NOTE — BH Assessment (Addendum)
Patient is to be admitted to [facility] by Dr. Toni Amend.  Attending Physician will be Dr. Jennet Maduro.   Patient has been assigned to room 315, by [facility] Charge Nurse T'Yawn.   ER staff is aware of the admission:  Misty Stanley, ER Secretary    Dr. Alphonzo Lemmings, ER MD   Geralynn Ochs, Patient's Nurse   Ethelene Browns, Patient Access.

## 2018-06-18 NOTE — ED Notes (Signed)
Called to [facility] nurse and informed her, there are no available officers to assist with bringing the patient to the BMU, will attempt to bring him down as soon as we can

## 2018-06-18 NOTE — ED Notes (Signed)
Patient continues to refuse to have the EKG done

## 2018-06-18 NOTE — ED Notes (Signed)
Patient will be transferring to BMU room 315, patient received discharge papers.Patient appropriate and cooperative, Denies SI/HI AVH. Vital signs taken. NAD noted.

## 2018-06-18 NOTE — ED Provider Notes (Signed)
-----------------------------------------   4:07 AM on 06/18/2018 -----------------------------------------   Blood pressure 128/72, pulse 61, temperature 98.3 F (36.8 C), temperature source Oral, resp. rate 18, height 1.778 m (5\' 10" ), weight 77.1 kg, SpO2 100 %.  The patient had no acute events since last update.  Calm and cooperative at this time.    I reviewed the [facility] report and recommendations.  They recommend inpatient psychiatric treatment of bipolar disorder, manic phase, severe without psychotic features in the setting of noncompliance of medications.  He recommended the following medications:  Zyprexa 10 mg p.o. nightly while patient is in the ED Lorazepam 1 mg p.o. nightly while patient is in the ED Lorazepam 2 mg p.o./IV/IM every 6 hours as needed for severe anxiety/EPS while patient is in the ED Zyprexa 5 mg by mouth/intramuscular every 6 hours as needed for psychotic agitation while patient is in the ED.  I ordered the scheduled QHS medications but will hold off on the PRN's since we can order and administer what is needed at the time in the event of acute agitation.  They also recommended obtaining an EKG to look for any evidence of conduction abnormalities or prolonged QTC which I have also ordered and will need to be followed up.   ----------------------------------------- 6:45 AM on 06/18/2018 -----------------------------------------  ECG will be obtained when the patient is awake and will be brought to the daytime ED provider for review.   Loleta Rose, MD 06/18/18 [id number]

## 2018-06-18 NOTE — ED Notes (Signed)
Patient refused breakfast 

## 2018-06-18 NOTE — ED Notes (Signed)
PT IVC PENDING ADMIT TO PSYCH.
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
## 2018-06-18 NOTE — ED Notes (Signed)
Attempted to perform an EKG on patient, patient refused and said "I dont want any treatment and I dont want to be bothered", will attempt again after lunch.
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
## 2018-06-19 LAB — LIPID PANEL
CHOLESTEROL: 157 mg/dL (ref 0–200)
HDL: 45 mg/dL (ref >40)
LDL Cholesterol: 78 mg/dL (ref 0–99)
Total CHOL/HDL Ratio: 3.5 RATIO
Triglycerides: 170 mg/dL — ABNORMAL HIGH (ref <150)
VLDL: 34 mg/dL (ref 0–40)

## 2018-06-19 LAB — TSH: TSH: 2.212 u[IU]/mL (ref 0.350–4.500)

## 2018-06-19 MED ORDER — OLANZAPINE 10 MG PO TABS
30.0000 mg | ORAL_TABLET | Freq: Every day | ORAL | Status: DC
  Administered 2018-06-19 – 2018-06-23 (×5): 30 mg via ORAL
  Filled 2018-06-19 (×5): qty 3

## 2018-06-19 NOTE — Tx Team (Addendum)
Interdisciplinary Treatment and Diagnostic Plan Update  06/19/2018 Time of Session: 10/35 AM [name] MRN: [id number]  Principal Diagnosis: Bipolar I disorder, current or most recent episode manic, with psychotic features (H[facility])  Secondary Diagnoses: Principal Problem:   Bipolar I disorder, current or most recent episode manic, with psychotic features ([facility]) Active Problems:   Tobacco use disorder   Opioid use disorder, moderate, in early remission, on maintenance therapy, dependence (HCC)   Current Medications:  Current Facility-Administered Medications  Medication Dose Route Frequency Provider Last Rate Last Dose  . acetaminophen (TYLENOL) tablet 650 mg  650 mg Oral Q6H PRN [name], [name], MD      . alum & mag hydroxide-simeth (MAALOX/MYLANTA) 200-200-20 MG/5ML suspension 30 mL  30 mL Oral Q4H PRN [name], [name], MD      . buprenorphine-naloxone (SUBOXONE) 8-2 mg per SL tablet 1 tablet  1 tablet Sublingual BID [name], [name], MD   1 tablet at 06/19/18 [id number]  . divalproex (DEPAKOTE) DR tablet 750 mg  750 mg Oral Q12H [name], [name], MD      . hydrOXYzine (ATARAX/VISTARIL) tablet 50 mg  50 mg Oral TID PRN [name], [name], MD      . magnesium hydroxide (MILK OF MAGNESIA) suspension 30 mL  30 mL Oral Daily PRN [name], [name], MD      . nicotine (NICODERM CQ - dosed in mg/24 hours) patch 21 mg  21 mg Transdermal Daily [name], [name], MD      . OLANZapine (ZYPREXA) tablet 20 mg  20 mg Oral QHS [name], [name], MD   20 mg at 06/18/18 2118  . paliperidone (INVEGA SUSTENNA) injection 234 mg  234 mg Intramuscular Q28 days [name], [name], MD      . temazepam (RESTORIL) capsule 15 mg  15 mg Oral QHS [name], [name], MD       PTA Medications: Medications Prior to Admission  Medication Sig Dispense Refill Last Dose  . divalproex (DEPAKOTE) 250 MG DR tablet Take 3 tablets (750 mg total) by mouth every 12 (twelve) hours. (Patient not taking: Reported on 06/17/2018) 180  tablet 1 Not Taking at Unknown time  . OLANZapine (ZYPREXA) 20 MG tablet Take 1 tablet (20 mg total) by mouth at bedtime. (Patient not taking: Reported on 06/17/2018) 30 tablet 1 Not Taking at Unknown time  . paliperidone (INVEGA SUSTENNA) 234 MG/1.5ML SUSP injection Inject 234 mg into the muscle every 28 (twenty-eight) days. Next injection on 07/05/2017. (Patient not taking: Reported on 06/17/2018) 0.9 mL 1 Not Taking at Unknown time  . temazepam (RESTORIL) 15 MG capsule Take 1 capsule (15 mg total) by mouth at bedtime. (Patient not taking: Reported on 06/17/2018) 30 capsule 0 Not Taking at Unknown time    Patient Stressors: Financial difficulties Marital or family conflict Occupational concerns  Patient Strengths: Average or above average intelligence Capable of independent living Manufacturing systems engineer Motivation for treatment/growth  Treatment Modalities: Medication Management, Group therapy, Case management,  1 to 1 session with clinician, Psychoeducation, Recreational therapy.   Physician Treatment Plan for Primary Diagnosis: Bipolar I disorder, current or most recent episode manic, with psychotic features (HCC) Long Term Goal(s):     Short Term Goals:    Medication Management: Evaluate patient's response, side effects, and tolerance of medication regimen.  Therapeutic Interventions: 1 to 1 sessions, Unit Group sessions and Medication administration.  Evaluation of Outcomes: Progressing  Physician Treatment Plan for Secondary Diagnosis: Principal Problem:   Bipolar I disorder,  current or most recent episode manic, with psychotic features (HCC) Active Problems:   Tobacco use disorder   Opioid use disorder, moderate, in early remission, on maintenance therapy, dependence (HCC)  Long Term Goal(s):     Short Term Goals:       Medication Management: Evaluate patient's response, side effects, and tolerance of medication regimen.  Therapeutic Interventions: 1 to 1 sessions, Unit  Group sessions and Medication administration.  Evaluation of Outcomes: Progressing   RN Treatment Plan for Primary Diagnosis: Bipolar I disorder, current or most recent episode manic, with psychotic features ([facility]) Long Term Goal(s): Knowledge of disease and therapeutic regimen to maintain health will improve  Short Term Goals: Ability to verbalize frustration and anger appropriately will improve, Ability to participate in decision making will improve, Ability to identify and develop effective coping behaviors will improve and Compliance with prescribed medications will improve  Medication Management: RN will administer medications as ordered by provider, will assess and evaluate patient's response and provide education to patient for prescribed medication. RN will report any adverse and/or side effects to prescribing provider.  Therapeutic Interventions: 1 on 1 counseling sessions, Psychoeducation, Medication administration, Evaluate responses to treatment, Monitor vital signs and CBGs as ordered, Perform/monitor CIWA, COWS, AIMS and Fall Risk screenings as ordered, Perform wound care treatments as ordered.  Evaluation of Outcomes: Progressing   LCSW Treatment Plan for Primary Diagnosis: Bipolar I disorder, current or most recent episode manic, with psychotic features (HCC) Long Term Goal(s): Safe transition to appropriate next level of care at discharge, Engage patient in therapeutic group addressing interpersonal concerns.  Short Term Goals: Engage patient in aftercare planning with referrals and resources and Increase skills for wellness and recovery  Therapeutic Interventions: Assess for all discharge needs, 1 to 1 time with Social worker, Explore available resources and support systems, Assess for adequacy in community support network, Educate family and significant other(s) on suicide prevention, Complete Psychosocial Assessment, Interpersonal group therapy.  Evaluation of Outcomes:  Progressing   Progress in Treatment: Attending groups: Yes. Participating in groups: Yes. Taking medication as prescribed: Yes. and As evidenced by:  Pt has refused to take some of his medications. Toleration medication: Yes. Family/Significant other contact made: No, will contact:  CSW will contact with consent Patient understands diagnosis: Yes. Discussing patient identified problems/goals with staff: No. Medical problems stabilized or resolved: Yes. Denies suicidal/homicidal ideation: Yes. Issues/concerns per patient self-inventory: No. Other: n/a  New problem(s) identified: No, Describe:  No new problems identifed  New Short Term/Long Term Goal(s):  Patient Goals:  "I need a prescription for Zyprexa"  Discharge Plan or Barriers: Tentative plan is for pt to return to his home with outpatient services to resume with [facility]  Reason for Continuation of Hospitalization: Aggression Medication stabilization  Estimated Length of Stay: 3-5 days Recreational Therapy: Patient Stressors: N/A Patient Goal: Patient will identify 3 positive coping skills strategies to use post d/c within 5 recreation therapy group sessions  Attendees: Patient: Brandon Dodson 06/19/2018 12:27 PM  Physician: Kristine Linea, MD 06/19/2018 12:27 PM  Nursing: Bruce Donath, RN 06/19/2018 12:27 PM  RN Care Manager: 06/19/2018 12:27 PM  Social Worker: Huey Romans, LCSW 06/19/2018 12:27 PM  Recreational Therapist: Garret Reddish, LRT 06/19/2018 12:27 PM  Other: Johny Shears, LCSWA 06/19/2018 12:27 PM  Other: Damian Leavell, Chaplain 06/19/2018 12:27 PM  Other: 06/19/2018 12:27 PM    Scribe for Treatment Team: Alease Frame, LCSW 06/19/2018 12:27 PM

## 2018-06-19 NOTE — Progress Notes (Signed)
[facility] MD Progress Note  06/20/2018 4:28 PM Brandon Dodson  MRN:  [id number]  Subjective:   Irritable, demanding, short. Wants to be discharge as 72-hours has already passed. Refuses medications except for Zyprexa.   Principal Problem: Bipolar I disorder, current or most recent episode manic, with psychotic features (HCC) Diagnosis:   Patient Active Problem List   Diagnosis Date Noted  . Bipolar I disorder, current or most recent episode manic, with psychotic features (HCC) [F31.2] 05/31/2017    Priority: High  . Opioid use disorder, moderate, in early remission, on maintenance therapy, dependence (HCC) [F11.21] 06/18/2018  . Tobacco use disorder [F17.200] 05/31/2017   Total Time spent with patient: 20 minutes  Past Psychiatric History: bipolar disorder  Past Medical History: History reviewed. No pertinent past medical history.  Past Surgical History:  Procedure Laterality Date  . DENTAL SURGERY     Family History: History reviewed. No pertinent family history. Family Psychiatric  History: grandfather, father and half-sister with bipolar Social History:  Social History   Substance and Sexual Activity  Alcohol Use Yes     Social History   Substance and Sexual Activity  Drug Use Not on file    Social History   Socioeconomic History  . Marital status: Single    Spouse name: Not on file  . Number of children: Not on file  . Years of education: Not on file  . Highest education level: Not on file  Occupational History  . Not on file  Social Needs  . Financial resource strain: Not on file  . Food insecurity:    Worry: Not on file    Inability: Not on file  . Transportation needs:    Medical: Not on file    Non-medical: Not on file  Tobacco Use  . Smoking status: Current Every Day Smoker    Packs/day: 1.00    Types: Cigarettes  . Smokeless tobacco: Never Used  Substance and Sexual Activity  . Alcohol use: Yes  . Drug use: Not on file  . Sexual activity: Not  on file  Lifestyle  . Physical activity:    Days per week: Not on file    Minutes per session: Not on file  . Stress: Not on file  Relationships  . Social connections:    Talks on phone: Not on file    Gets together: Not on file    Attends religious service: Not on file    Active member of club or organization: Not on file    Attends meetings of clubs or organizations: Not on file    Relationship status: Not on file  Other Topics Concern  . Not on file  Social History Narrative  . Not on file   Additional Social History:                         Sleep: Poor  Appetite:  Fair  Current Medications: Current Facility-Administered Medications  Medication Dose Route Frequency Provider Last Rate Last Dose  . acetaminophen (TYLENOL) tablet 650 mg  650 mg Oral Q6H PRN [name], [name], MD      . alum & mag hydroxide-simeth (MAALOX/MYLANTA) 200-200-20 MG/5ML suspension 30 mL  30 mL Oral Q4H PRN [name], [name], MD      . buprenorphine-naloxone (SUBOXONE) 8-2 mg per SL tablet 1 tablet  1 tablet Sublingual BID [name], [name], MD   1 tablet at 06/20/18 0949  . divalproex (DEPAKOTE) DR tablet 750  mg  750 mg Oral Q12H [name], [name], MD      . hydrOXYzine (ATARAX/VISTARIL) tablet 50 mg  50 mg Oral TID PRN [name], [name], MD      . magnesium hydroxide (MILK OF MAGNESIA) suspension 30 mL  30 mL Oral Daily PRN [name], [name], MD      . nicotine (NICODERM CQ - dosed in mg/24 hours) patch 21 mg  21 mg Transdermal Daily Janissa Bertram B, MD      . OLANZapine (ZYPREXA) tablet 30 mg  30 mg Oral QHS Rubi Tooley B, MD   30 mg at 06/19/18 2109  . temazepam (RESTORIL) capsule 15 mg  15 mg Oral QHS [name], [name], MD        Lab Results: No results found for this or any previous visit (from the past 48 hour(s)).  Blood Alcohol level:  Lab Results  Component Value Date   ETH <10 06/17/2018   ETH <5 05/29/2017    Metabolic Disorder Labs: Lab Results  Component Value Date    HGBA1C 5.1 06/17/2018   MPG 100 06/17/2018   MPG 91.06 06/01/2017   No results found for: PROLACTIN Lab Results  Component Value Date   CHOL 157 06/17/2018   TRIG 170 (H) 06/17/2018   HDL 45 06/17/2018   CHOLHDL 3.5 06/17/2018   VLDL 34 06/17/2018   LDLCALC 78 06/17/2018   LDLCALC 93 06/01/2017    Physical Findings: AIMS: Facial and Oral Movements Muscles of Facial Expression: None, normal Lips and Perioral Area: None, normal Jaw: None, normal Tongue: None, normal,Extremity Movements Upper (arms, wrists, hands, fingers): None, normal Lower (legs, knees, ankles, toes): None, normal, Trunk Movements Neck, shoulders, hips: None, normal, Overall Severity Severity of abnormal movements (highest score from questions above): None, normal Incapacitation due to abnormal movements: None, normal Patient's awareness of abnormal movements (rate only patient's report): No Awareness, Dental Status Current problems with teeth and/or dentures?: No Does patient usually wear dentures?: No  CIWA:  CIWA-Ar Total: 2 COWS:  COWS Total Score: 2  Musculoskeletal: Strength & Muscle Tone: within normal limits Gait & Station: normal Patient leans: N/A  Psychiatric Specialty Exam: Physical Exam  Nursing note and vitals reviewed. Psychiatric: His affect is angry and inappropriate. His speech is rapid and/or pressured. He is withdrawn. Thought content is paranoid and delusional. Cognition and memory are impaired. He expresses impulsivity.    Review of Systems  Neurological: Negative.   Psychiatric/Behavioral: The patient has insomnia.   All other systems reviewed and are negative.   Blood pressure 114/63, pulse (!) 56, temperature 98 F (36.7 C), temperature source Oral, resp. rate 18, height 5\' 9"  (1.753 m), weight 73 kg, SpO2 100 %.Body mass index is 23.78 kg/m.  General Appearance: Casual  Eye Contact:  Good  Speech:  Pressured  Volume:  Increased  Mood:  Angry, Dysphoric and Irritable   Affect:  Congruent  Thought Process:  Goal Directed and Descriptions of Associations: Tangential  Orientation:  Full (Time, Place, and Person)  Thought Content:  Illogical, Delusions and Paranoid Ideation  Suicidal Thoughts:  No  Homicidal Thoughts:  No  Memory:  Immediate;   Fair Recent;   Fair Remote;   Fair  Judgement:  Poor  Insight:  Lacking  Psychomotor Activity:  Increased  Concentration:  Concentration: Poor and Attention Span: Poor  Recall:  Poor  Fund of Knowledge:  Fair  Language:  Poor  Akathisia:  No  Handed:  Right  AIMS (if  indicated):     Assets:  Communication Skills Desire for Improvement Financial Resources/Insurance Housing Physical Health Social Support  ADL's:  Intact  Cognition:  WNL  Sleep:  Number of Hours: 6.45     Treatment Plan Summary: Daily contact with patient to assess and evaluate symptoms and progress in treatment and Medication management   Brandon Dodson is a 38 year old male with a history of bipolar disorder admitted for manic episode in the context of treatment noncompliance.   #Mood/Psychosis -restart Zyprexa 30 mg nightly -refuses Depakote 750 mg BID -Restoril 15 mg nightly  #Chronic pain -Suboxone 8 mg BID  #Smoking cessation -nicotine patch is available  #Labs -lipid panel, TSH, A1C are normal -EKG reviewed, sinus bradycardia with QTc 386  #Disposition -discharge to home -follow up with [facility]  Kristine Linea, MD 06/20/2018, 4:28 PM

## 2018-06-19 NOTE — Progress Notes (Signed)
Received Brandon Dodson this AM in his room awake in bed, he refused his Depakote,but took the Suboxone SL. He denied all of the psychiatric symptomns. He has been isolated to his room the entire day except for meals.

## 2018-06-19 NOTE — Progress Notes (Signed)
Recreation Therapy Notes   Time: 9:30 am  Location: Craft Room  Behavioral response: Appropriate   Intervention Topic: Self-Care  Discussion/Intervention:  Group content today was focused on Self-Care. The group defined self-care and some positive ways they care for themselves. Individuals expressed ways and reasons why they neglected any self-care in the past. Patients described ways to improve self-care in the future. The group explained what could happen if they did not do any self-care activities at all. The group participated in the intervention "self-care assessment" where they had a chance to discover some of their weaknesses and strengths in self- care. Patient came up with a self-care plan to improve themselves in the future.  .  Clinical Observations/Feedback:  Patient attended group and stated he can care for himself by being more assertive and not taking on too much. Individual was social with staff while participating in group. Glorya Bartley LRT/CTRS         Tabetha Haraway 06/19/2018 9:51 AM

## 2018-06-19 NOTE — BHH Group Notes (Signed)
LCSW Group Therapy Note  06/19/2018 1:00 pm  Type of Therapy/Topic:  Group Therapy:  Emotion Regulation  Participation Level:  Minimal   Description of Group:    The purpose of this group is to assist patients in learning to regulate negative emotions and experience positive emotions. Patients will be guided to discuss ways in which they have been vulnerable to their negative emotions. These vulnerabilities will be juxtaposed with experiences of positive emotions or situations, and patients will be challenged to use positive emotions to combat negative ones. Special emphasis will be placed on coping with negative emotions in conflict situations, and patients will process healthy conflict resolution skills.  Therapeutic Goals: 1. Patient will identify two positive emotions or experiences to reflect on in order to balance out negative emotions 2. Patient will label two or more emotions that they find the most difficult to experience 3. Patient will demonstrate positive conflict resolution skills through discussion and/or role plays  Summary of Patient Progress:  Brandon Dodson did not actively participate in today's group discussion on emotion regulation even with CSW's encouragement.  The only thing that pt shared was that he likes to engage in outdoor activities(playing with his dog, fishing) to help him to work through negative emotions.     Therapeutic Modalities:   Cognitive Behavioral Therapy Feelings Identification Dialectical Behavioral Therapy

## 2018-06-19 NOTE — Progress Notes (Signed)
Recreation Therapy Notes  INPATIENT RECREATION THERAPY ASSESSMENT  Patient Details Name: Brandon Dodson MRN: [id number] DOB: December 13, 1980 Today's Date: 06/19/2018       Information Obtained From: Patient  Able to Participate in Assessment/Interview: Yes  Patient Presentation: Responsive  Reason for Admission (Per Patient): (My Behavior)  Patient Stressors:    Coping Skills:   Meditate  Leisure Interests (2+):  Exercise - Walking(Play with dog)  Frequency of Recreation/Participation: Weekly  Awareness of Community Resources:  Yes  Community Resources:  [place], [place]  Current Use: Yes  If no, Barriers?:    Expressed Interest in [employer] Information:    [place] of Residence:  Film/video editor  Patient Main Form of Transportation: Walk  Patient Strengths:  Smart, nice, kind,gentle  Patient Identified Areas of Improvement:  Be more assertive  Patient Goal for Hospitalization:  Get through this, participate and identify some issues  Current SI (including self-harm):  No  Current HI:  No  Current AVH: No  Staff Intervention Plan: Group Attendance, Collaborate with Interdisciplinary Treatment Team  Consent to Intern Participation: N/A  Tessy Pawelski 06/19/2018, 9:48 AM

## 2018-06-19 NOTE — Progress Notes (Signed)
   06/19/18 1030  Clinical Encounter Type  Visited With Patient  Visit Type Initial;Spiritual support;Behavioral Health  Referral From Social work  Consult/Referral To Chaplain  Spiritual Encounters  Spiritual Needs Emotional;Other (Comment)   CH attended the patient's treatment team meeting. Brandon Dodson was very quite and evasive when asked questions. He stated that he has been on pain meds since he was 7 but when the MD asked why this was the first time she had heard of this, the patient stated that "because I didn't want to tell you." The patient states that he wants meds for depression but refused a shot earlier today because he says that the shot does not work. The patient seems very suspicious of any question.
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
## 2018-06-20 DIAGNOSIS — F312 Bipolar disorder, current episode manic severe with psychotic features: Principal | ICD-10-CM

## 2018-06-20 LAB — HEMOGLOBIN A1C
HEMOGLOBIN A1C: 5.1 % (ref 4.8–5.6)
Mean Plasma Glucose: 100 mg/dL

## 2018-06-20 NOTE — Plan of Care (Signed)
Has been in the dayroom, calm and cooperative.

## 2018-06-20 NOTE — Progress Notes (Signed)
Recreation Therapy Notes  Date: 06/20/2018  Time: 9:30 am  Location: Craft Room  Behavioral response: Not Engaged  Intervention Topic: Problem Solving  Discussion/Intervention:  Group content on today was focused on problem solving. The group described what problem solving is. Patients expressed how problems affect them and how they deal with problems. Individuals identified healthy ways to deal with problems. Patients explained what normally happens to them when they do not deal with problems. The group expressed reoccurring problems for them. The group participated in the intervention "Ways to Solve problems" where patients were given a chance to explore different ways to solve problems.  Clinical Observations/Feedback:  Patient came to group and watched his peers participate in group. He did not provide any contributions toward group.  Maija Biggers LRT/CTRS         Jerzie Bieri 06/20/2018 11:16 AM

## 2018-06-20 NOTE — [facility] Group Notes (Signed)
  LCSW Group Therapy Note  06/20/2018 1:00pm  Type of Therapy/Topic:  Group Therapy:  Balance in Life  Participation Level:  Active  Description of Group:    This group will address the concept of balance and how it feels and looks when one is unbalanced. Patients will be encouraged to process areas in their lives that are out of balance and identify reasons for remaining unbalanced. Facilitators will guide patients in utilizing problem-solving interventions to address and correct the stressor making their life unbalanced. Understanding and applying boundaries will be explored and addressed for obtaining and maintaining a balanced life. Patients will be encouraged to explore ways to assertively make their unbalanced needs known to significant others in their lives, using other group members and facilitator for support and feedback.  Therapeutic Goals: 1. Patient will identify two or more emotions or situations they have that consume much of in their lives. 2. Patient will identify signs/triggers that life has become out of balance:  3. Patient will identify two ways to set boundaries in order to achieve balance in their lives:  4. Patient will demonstrate ability to communicate their needs through discussion and/or role plays  Summary of Patient Progress:  Patient was alert and seemed to be interested in sharing and had a good sense of humor. He was supported by his peers.    Therapeutic Modalities:   Cognitive Behavioral Therapy Solution-Focused Therapy Assertiveness Training  Cheron Schaumann, [place] 06/20/2018 2:55 PM

## 2018-06-20 NOTE — Plan of Care (Addendum)
Patient is stable and alert . Mostly in his room a lot , come out for meals, the only medication patient says he want to take is zyprexa, and refuse any other medications, not sociable with peers , feels he is not suppose to be here. Avoiding any social contacts, maintaining safety in the unit , denies any SI/HI and no signes of AVH, 15 minutes safety checks is in progress, sleep long hours with any interruptions, no distress noted.  0600 patient is now refusing the staff to do vital signs on him, with out any reason.  Problem: Education: Goal: Utilization of techniques to improve thought processes will improve Outcome: Progressing Goal: Knowledge of the prescribed therapeutic regimen will improve Outcome: Progressing   Problem: Activity: Goal: Interest or engagement in leisure activities will improve Outcome: Progressing Goal: Imbalance in normal sleep/wake cycle will improve Outcome: Progressing   Problem: Coping: Goal: Coping ability will improve Outcome: Progressing Goal: Will verbalize feelings Outcome: Progressing   Problem: Education: Goal: Ability to state activities that reduce stress will improve Outcome: Progressing   Problem: Coping: Goal: Ability to verbalize frustrations and anger appropriately will improve Outcome: Progressing   Problem: Safety: Goal: Ability to demonstrate self-control will improve Outcome: Progressing Goal: Ability to redirect hostility and anger into socially appropriate behaviors will improve Outcome: Progressing   Problem: Education: Goal: Knowledge of General Education information will improve Description Including pain rating scale, medication(s)/side effects and non-pharmacologic comfort measures Outcome: Progressing   Problem: Health Behavior/Discharge Planning: Goal: Ability to manage health-related needs will improve Outcome: Progressing   Problem: Clinical Measurements: Goal: Ability to maintain clinical measurements within  normal limits will improve Outcome: Progressing Goal: Will remain free from infection Outcome: Progressing Goal: Diagnostic test results will improve Outcome: Progressing Goal: Respiratory complications will improve Outcome: Progressing Goal: Cardiovascular complication will be avoided Outcome: Progressing   Problem: Activity: Goal: Risk for activity intolerance will decrease Outcome: Progressing   Problem: Nutrition: Goal: Adequate nutrition will be maintained Outcome: Progressing   Problem: Coping: Goal: Level of anxiety will decrease Outcome: Progressing   Problem: Elimination: Goal: Will not experience complications related to bowel motility Outcome: Progressing Goal: Will not experience complications related to urinary retention Outcome: Progressing   Problem: Pain Managment: Goal: General experience of comfort will improve Outcome: Progressing   Problem: Safety: Goal: Ability to remain free from injury will improve Outcome: Progressing   Problem: Skin Integrity: Goal: Risk for impaired skin integrity will decrease Outcome: Progressing
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
## 2018-06-20 NOTE — Plan of Care (Signed)
Patient denies SI, HI and AVH. Patient pleasant; refuses medications except for Zyprexa and Suboxone. Patient states the Zyprexa last night was 30mg  and it put him right to sleep. Patient attended groups today. Pain rating 0/10; showered and dressed self; flat affect; preoccupied with leaving. No self injurious Problem: Education: Goal: Utilization of techniques to improve thought processes will improve Outcome: Progressing Goal: Knowledge of the prescribed therapeutic regimen will improve Outcome: Progressing   Problem: Activity: Goal: Interest or engagement in leisure activities will improve Outcome: Progressing Goal: Imbalance in normal sleep/wake cycle will improve Outcome: Progressing   Problem: Coping: Goal: Coping ability will improve Outcome: Progressing Goal: Will verbalize feelings Outcome: Progressing   behaviors noted.

## 2018-06-20 NOTE — [facility] Group Notes (Signed)
[facility] Group Notes:  (Nursing/MHT/Case Management/Adjunct)  Date:  06/20/2018  Time:  2:12 AM  Type of Therapy:  Group Therapy  Participation Level:  Minimal  Participation Quality:  Appropriate  Affect:  Appropriate  Cognitive:  Alert  Insight:  Good  Engagement in Group:  Supportive  Modes of Intervention:  Support  Summary of Progress/Problems:  Brandon Dodson 06/20/2018, 2:12 AM

## 2018-06-20 NOTE — [facility] Suicide Risk Assessment (Signed)
[facility] INPATIENT:  Family/Significant Other Suicide Prevention Education  Suicide Prevention Education:  Patient Refusal for Family/Significant Other Suicide Prevention Education: The patient Brandon Dodson has refused to provide written consent for family/significant other to be provided Family/Significant Other Suicide Prevention Education during admission and/or prior to discharge.  Physician notified.  Alease Frame, LCSW 06/20/2018, 4:54 PM

## 2018-06-21 NOTE — Plan of Care (Signed)
Patient is alert and oriented, denies SI, HI and AVH. Patient is pleasant and appropriate on the unit. Patient tends to isolate his room during the morning, but will attend groups later on in the evening. Patient can be animated at times when talking with nurse. Rates pain 0/10. Patient showers and dresses appropriately. Patient agreed to take Suboxone this morning but refuses Depakote.  Nurse will continue to monitor. Problem: Education: Goal: Utilization of techniques to improve thought processes will improve Outcome: Progressing Goal: Knowledge of the prescribed therapeutic regimen will improve Outcome: Progressing   Problem: Activity: Goal: Interest or engagement in leisure activities will improve Outcome: Progressing Goal: Imbalance in normal sleep/wake cycle will improve Outcome: Progressing   Problem: Coping: Goal: Coping ability will improve Outcome: Progressing Goal: Will verbalize feelings Outcome: Progressing   Problem: Education: Goal: Ability to state activities that reduce stress will improve Outcome: Progressing

## 2018-06-21 NOTE — Plan of Care (Signed)
Alert and oriented but isolative in room, refusing to participate in group activities. Focussed on being discharged.

## 2018-06-21 NOTE — Progress Notes (Signed)
Brandon Dodson has been in bed. He is awake, alert and oriented. Was encouraged to participate in evening activities but stated "I will stay in here as long as they don't let me go". Patient was educated about discharge process but stated "well I will go home anyway". Patient not willing to participate. Received medication and was encouraged to call as needed. Safety precautions reinforced.

## 2018-06-21 NOTE — Progress Notes (Signed)
Recreation Therapy Notes  Date: 06/21/2018  Time: 9:30 am   Location: Craft room   Behavioral response: N/A   Intervention Topic: Creative Expressions  Discussion/Intervention: Patient did not attend group.   Clinical Observations/Feedback:  Patient did not attend group.   Blain Hunsucker LRT/CTRS        Brandon Dodson 06/21/2018 11:30 AM 

## 2018-06-21 NOTE — [facility] Group Notes (Signed)
[facility] Group Notes:  (Nursing/MHT/Case Management/Adjunct)  Date:  06/21/2018  Time:  9:39 AM  Type of Therapy:  Psychoeducational Skills  Participation Level:  Did Not Attend  [name] 06/21/2018, 9:39 AM

## 2018-06-21 NOTE — [facility] Group Notes (Signed)
  06/21/2018 1:00pm  Type of Therapy and Topic:  Group Therapy:  Feelings around Relapse and Recovery  Participation Level:  Did Not Attend   Description of Group:    Patients in this group will discuss emotions they experience before and after a relapse. They will process how experiencing these feelings, or avoidance of experiencing them, relates to having a relapse. Facilitator will guide patients to explore emotions they have related to recovery. Patients will be encouraged to process which emotions are more powerful. They will be guided to discuss the emotional reaction significant others in their lives may have to their relapse or recovery. Patients will be assisted in exploring ways to respond to the emotions of others without this contributing to a relapse.  Therapeutic Goals: Patient will identify two or more emotions that lead to a relapse for them Patient will identify two emotions that result when they relapse Patient will identify two emotions related to recovery Patient will demonstrate ability to communicate their needs through discussion and/or role plays  Ashna Dorough LCSW 

## 2018-06-22 NOTE — Plan of Care (Signed)
Patient alert in oriented x 4. Remains isolative to his room during the day. Patient denies having any thought of self harm/ HI/AVH and pain. Showered today and dressed himself, compliant with treatment  plan. Affect is flat and sad, milieu remains safe with q 15 minute safety checks.
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
## 2018-06-22 NOTE — [facility] Group Notes (Signed)
LCSW Group Therapy Note   06/22/2018 1:15pm   Type of Therapy and Topic:  Group Therapy:  Trust and Honesty  Participation Level:  Did Not Attend  Description of Group:    In this group patients will be asked to explore the value of being honest.  Patients will be guided to discuss their thoughts, feelings, and behaviors related to honesty and trusting in others. Patients will process together how trust and honesty relate to forming relationships with peers, family members, and self. Each patient will be challenged to identify and express feelings of being vulnerable. Patients will discuss reasons why people are dishonest and identify alternative outcomes if one was truthful (to self or others). This group will be process-oriented, with patients participating in exploration of their own experiences, giving and receiving support, and processing challenge from other group members.   Therapeutic Goals: 1. Patient will identify why honesty is important to relationships and how honesty overall affects relationships.  2. Patient will identify a situation where they lied or were lied too and the  feelings, thought process, and behaviors surrounding the situation 3. Patient will identify the meaning of being vulnerable, how that feels, and how that correlates to being honest with self and others. 4. Patient will identify situations where they could have told the truth, but instead lied and explain reasons of dishonesty.   Summary of Patient Progress:Pt was invited to attend group but chose not to attend. CSW will continue to encourage pt to attend group throughout their admission.     Therapeutic Modalities:   Cognitive Behavioral Therapy Solution Focused Therapy Motivational Interviewing Brief Therapy  Thressa Shiffer  CUEBAS-COLON, LCSW 06/22/2018 11:58 AM  

## 2018-06-23 MED ORDER — TEMAZEPAM 15 MG PO CAPS
15.0000 mg | ORAL_CAPSULE | Freq: Every evening | ORAL | Status: DC | PRN
  Administered 2018-06-23: 15 mg via ORAL
  Filled 2018-06-23: qty 1

## 2018-06-23 NOTE — Plan of Care (Signed)
Patient is progressing nicely and adjusting to the unit guidelines, maintaining safety , taking his medicines with out any noticeable side effects, encourage patient to continue improving his coping skills , denies any SI/HI and no signs of AVH, 15 minute safety checks is in progress, sleep is continuous no distress.   Problem: Education: Goal: Utilization of techniques to improve thought processes will improve Outcome: Progressing Goal: Knowledge of the prescribed therapeutic regimen will improve Outcome: Progressing   Problem: Activity: Goal: Interest or engagement in leisure activities will improve Outcome: Progressing Goal: Imbalance in normal sleep/wake cycle will improve Outcome: Progressing   Problem: Coping: Goal: Coping ability will improve Outcome: Progressing Goal: Will verbalize feelings Outcome: Progressing   Problem: Education: Goal: Ability to state activities that reduce stress will improve Outcome: Progressing   Problem: Coping: Goal: Ability to verbalize frustrations and anger appropriately will improve Outcome: Progressing   Problem: Safety: Goal: Ability to demonstrate self-control will improve Outcome: Progressing Goal: Ability to redirect hostility and anger into socially appropriate behaviors will improve Outcome: Progressing   Problem: Education: Goal: Knowledge of General Education information will improve Description Including pain rating scale, medication(s)/side effects and non-pharmacologic comfort measures Outcome: Progressing   Problem: Health Behavior/Discharge Planning: Goal: Ability to manage health-related needs will improve Outcome: Progressing   Problem: Clinical Measurements: Goal: Ability to maintain clinical measurements within normal limits will improve Outcome: Progressing Goal: Will remain free from infection Outcome: Progressing Goal: Diagnostic test results will improve Outcome: Progressing Goal: Respiratory  complications will improve Outcome: Progressing Goal: Cardiovascular complication will be avoided Outcome: Progressing   Problem: Activity: Goal: Risk for activity intolerance will decrease Outcome: Progressing   Problem: Nutrition: Goal: Adequate nutrition will be maintained Outcome: Progressing   Problem: Coping: Goal: Level of anxiety will decrease Outcome: Progressing   Problem: Elimination: Goal: Will not experience complications related to bowel motility Outcome: Progressing Goal: Will not experience complications related to urinary retention Outcome: Progressing   Problem: Pain Managment: Goal: General experience of comfort will improve Outcome: Progressing   Problem: Safety: Goal: Ability to remain free from injury will improve Outcome: Progressing   Problem: Skin Integrity: Goal: Risk for impaired skin integrity will decrease Outcome: Progressing

## 2018-06-23 NOTE — Plan of Care (Addendum)
Patient found in day room upon my arrival. Patient is visible and social this evening. Patient is pleasant and polite upon approach. Reports mood is "fine" and affect is animated. Denies all complaints including SI/HI/AVH. Reports eating and voiding adequately. Compliant with HS medications and staff direction. Given Restoril for sleep with positive results. Q 15 minute checks maintained. Will continue to monitor throughout the shift. Patient slept 6.5 hours. No apparent distress. Refused blood draw this am. Will endorse care to oncoming shift.  Problem: Education: Goal: Knowledge of the prescribed therapeutic regimen will improve Outcome: Progressing   Problem: Activity: Goal: Imbalance in normal sleep/wake cycle will improve Outcome: Progressing   Problem: Coping: Goal: Coping ability will improve Outcome: Progressing   Problem: Safety: Goal: Ability to demonstrate self-control will improve Outcome: Progressing

## 2018-06-23 NOTE — Plan of Care (Signed)
Patient remains isolative to his room for the majority of the shift. Patient is present in the milieu for meals and medications. Patient was laughing and smiling this morning when communicating with this Clinical research associate. Took all of his medications and denies having any thoughts of wanting to harm himself or anyone else. Milieu remains safe with q 15 minute safety checks. Nurse will continue to monitor patient.

## 2018-06-23 NOTE — [facility] Group Notes (Signed)
LCSW Group Therapy Note 06/23/2018 1:15pm  Type of Therapy and Topic: Group Therapy: Feelings Around Returning Home & Establishing a Supportive Framework and Supporting Oneself When Supports Not Available  Participation Level: Active  Description of Group:  Patients first processed thoughts and feelings about upcoming discharge. These included fears of upcoming changes, lack of change, new living environments, judgements and expectations from others and overall stigma of mental health issues. The group then discussed the definition of a supportive framework, what that looks and feels like, and how do to discern it from an unhealthy non-supportive network. The group identified different types of supports as well as what to do when your family/friends are less than helpful or unavailable  Therapeutic Goals  1. Patient will identify one healthy supportive network that they can use at discharge. 2. Patient will identify one factor of a supportive framework and how to tell it from an unhealthy network. 3. Patient able to identify one coping skill to use when they do not have positive supports from others. 4. Patient will demonstrate ability to communicate their needs through discussion and/or role plays.  Summary of Patient Progress:  The patient reports he feels "alright." Pt engaged during group session. As patients processed their anxiety about discharge and described healthy supports patient shared he is ready to be discharge because he can take care of himself. He listed his family and himself as his main support.  Patients identified at least one self-care tool they were willing to use after discharge, counting and meditating.   Therapeutic Modalities Cognitive Behavioral Therapy Motivational Interviewing   Brandon Dodson  CUEBAS-COLON, LCSW 06/23/2018 11:40 AM

## 2018-06-23 NOTE — [facility] Group Notes (Signed)
[facility] Group Notes:  (Nursing/MHT/Case Management/Adjunct)  Date:  06/23/2018  Time:  9:59 PM  Type of Therapy:  Group Therapy  Participation Level:  Active  Participation Quality:  Appropriate  Affect:  Appropriate  Cognitive:  Appropriate  Insight:  Appropriate  Engagement in Group:  Engaged  Modes of Intervention:  Discussion  Summary of Progress/Problems: MHT went over the rules and expectations of the unit. MHT reviewed visitation and call hours. MHT informed patients not to go in other patient's rooms. MHT informed patients not to share or loan out clothes MHT informed patients not to share personal information or use last names while on the unit. MHT informed patients of routine checks throughout the night. MHT encouraged patients to clean up after themselves MHT processed with patients about effective communication with doctors.  Jinger Neighbors 06/23/2018, 9:59 PM
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
## 2018-06-24 LAB — AMMONIA: Ammonia: 25 umol/L (ref 9–35)

## 2018-06-24 LAB — VALPROIC ACID LEVEL: Valproic Acid Lvl: 93 ug/mL (ref 50.0–100.0)

## 2018-06-24 MED ORDER — OLANZAPINE 15 MG PO TABS: 30.0000 mg | ORAL_TABLET | Freq: Every day | ORAL | 1 refills | Status: DC

## 2018-06-24 MED ORDER — DIVALPROEX SODIUM 250 MG PO DR TAB: 750.0000 mg | DELAYED_RELEASE_TABLET | Freq: Two times a day (BID) | ORAL | 1 refills | Status: DC

## 2018-06-24 NOTE — Progress Notes (Signed)
  [facility] Adult Case Management Discharge Plan :  Will you be returning to the same living situation after discharge:  Yes,    At discharge, do you have transportation home?: Yes,    Do you have the ability to pay for your medications: Yes,     Release of information consent forms completed and in the chart;  Patient's signature needed at discharge.  Patient to Follow up at: Follow-up Information    [facility] Go on 06/26/2018.   Why:  9:30am for hospital follow up.  If you are unable to attend please call and reschedule or walk in M, W, or Friday between 8am-3pm. Contact information: [address] [facility] [address] [phone]           Next level of care provider has access to [facility] Link:no  Safety Planning and Suicide Prevention discussed: Yes,     Have you used any form of tobacco in the last 30 days? (Cigarettes, Smokeless Tobacco, Cigars, and/or Pipes): No  Has patient been referred to the Quitline?: N/A patient is not a smoker  Patient has been referred for addiction treatment: Yes  Glennon Mac, LCSW 06/24/2018, 4:38 PM

## 2018-06-24 NOTE — Progress Notes (Signed)
Patient alert and oriented x 4. Ambulates unit with steady gait. Verbally denies SI/HI/AVH and pain. Patient discharged on above date and time. Verbalized understanding the discharge information provided to patient upon discharge. Patient departed unit with discharge paperwork, prescriptions, 7 day supply of medications and personal belongings. Patient stated that he drove himself and was parked in the ED parking lot. No distress noted.

## 2018-06-24 NOTE — Progress Notes (Signed)
Recreation Therapy Notes  Date: 06/24/2018  Time: 9:30 am  Location: Craft Room  Behavioral response: Appropriate   Intervention Topic: Self-Esteem  Discussion/Intervention:  Group content today was focused on self-esteem. Patient defined self-esteem and where it comes form. The group described reasons self-esteem is important. Individuals stated things that impact self-esteem and positive ways to improve self-esteem. The group participated in the intervention "Collage of Me" where patients were able to create a collage of positive things that makes them who they are. Clinical Observations/Feedback:  Patient came to group and was focused on what peers and staff had to say about self-esteem. Individual participated in the intervention during group. [name] LRT/CTRS          Brandon Dodson 06/24/2018 12:02 PM 

## 2018-06-24 NOTE — Progress Notes (Signed)
Recreation Therapy Notes  INPATIENT RECREATION TR PLAN  Patient Details Name: Brandon Dodson MRN: [id number] DOB: Jul 19, 1981 Today's Date: 06/24/2018  Rec Therapy Plan Is patient appropriate for Therapeutic Recreation?: Yes Treatment times per week: at least 3 Estimated Length of Stay: 5-7 days TR Treatment/Interventions: Group participation (Comment)  Discharge Criteria Pt will be discharged from therapy if:: Discharged Treatment plan/goals/alternatives discussed and agreed upon by:: Patient/family  Discharge Summary Short term goals set: Patient will identify 3 positive coping skills strategies to use post d/c within 5 recreation therapy group sessions Short term goals met: Adequate for discharge Progress toward goals comments: Groups attended Which groups?: Self-esteem, Other (Comment)(Problem Solving, Self-Care) Reason goals not met: N/A Therapeutic equipment acquired: N/A Reason patient discharged from therapy: Discharge from hospital Pt/family agrees with progress & goals achieved: Yes Date patient discharged from therapy: 06/24/18   Hetal Proano 06/24/2018, 12:44 PM

## 2018-06-24 NOTE — [facility] Suicide Risk Assessment (Signed)
[facility] Discharge Suicide Risk Assessment   Principal Problem: Bipolar I disorder, current or most recent episode manic, with psychotic features [facility]) Discharge Diagnoses:  Patient Active Problem List   Diagnosis Date Noted  . Bipolar I disorder, current or most recent episode manic, with psychotic features (HCC) [F31.2] 05/31/2017    Priority: High  . Opioid use disorder, moderate, in early remission, on maintenance therapy, dependence (HCC) [F11.21] 06/18/2018  . Tobacco use disorder [F17.200] 05/31/2017    Total Time spent with patient: 20 minutes  Musculoskeletal: Strength & Muscle Tone: within normal limits Gait & Station: normal Patient leans: N/A  Psychiatric Specialty Exam: Review of Systems  Neurological: Negative.   Psychiatric/Behavioral: Negative.   All other systems reviewed and are negative.   Blood pressure 97/71, pulse 87, temperature (!) 97.5 F (36.4 C), temperature source Oral, resp. rate 16, height 5\' 9"  (1.753 m), weight 73 kg, SpO2 100 %.Body mass index is 23.78 kg/m.  General Appearance: Casual  Eye Contact::  Good  Speech:  Clear and Coherent409  Volume:  Normal  Mood:  Euthymic  Affect:  Appropriate  Thought Process:  Goal Directed and Descriptions of Associations: Intact  Orientation:  Full (Time, Place, and Person)  Thought Content:  WDL  Suicidal Thoughts:  No  Homicidal Thoughts:  No  Memory:  Immediate;   Fair Recent;   Fair Remote;   Fair  Judgement:  Impaired  Insight:  Shallow  Psychomotor Activity:  Normal  Concentration:  Fair  Recall:  [employer] of Knowledge:Fair  Language: Fair  Akathisia:  No  Handed:  Right  AIMS (if indicated):     Assets:  Communication Skills Desire for Improvement Housing Intimacy Physical Health Resilience Social Support  Sleep:  Number of Hours: 6.5  Cognition: WNL  ADL's:  Intact   Mental Status Per Nursing Assessment::   On Admission:  NA  Demographic Factors:  Male, Caucasian and  Unemployed  Loss Factors: NA  Historical Factors: Family history of mental illness or substance abuse and Impulsivity  Risk Reduction Factors:   Sense of responsibility to family, Living with another person, especially a relative and Positive social support  Continued Clinical Symptoms:  Bipolar Disorder:   Mixed State  Cognitive Features That Contribute To Risk:  None    Suicide Risk:  Minimal: No identifiable suicidal ideation.  Patients presenting with no risk factors but with morbid ruminations; may be classified as minimal risk based on the severity of the depressive symptoms    Plan Of Care/Follow-up recommendations:  Activity:  as tolerated Diet:  low sodium heart healthy Other:  keep follow up appointments  Kristine Linea, MD 06/24/2018, 9:12 AM
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
## 2018-09-22 ENCOUNTER — Other Ambulatory Visit: Payer: Self-pay

## 2018-09-22 ENCOUNTER — Emergency Department: Payer: Medicaid - Out of State

## 2018-09-22 ENCOUNTER — Emergency Department
Admission: EM | Admit: 2018-09-22 | Discharge: 2018-09-22 | Discharge status: Against Medical Advice | Payer: Medicaid - Out of State | Attending: Student in an Organized Health Care Education/Training Program | Admitting: Student in an Organized Health Care Education/Training Program

## 2018-09-22 DIAGNOSIS — R200 Anesthesia of skin: Secondary | ICD-10-CM | POA: Diagnosis not present

## 2018-09-22 DIAGNOSIS — R079 Chest pain, unspecified: Secondary | ICD-10-CM | POA: Diagnosis present

## 2018-09-22 DIAGNOSIS — Z5321 Procedure and treatment not carried out due to patient leaving prior to being seen by health care provider: Secondary | ICD-10-CM | POA: Insufficient documentation

## 2018-09-22 LAB — BASIC METABOLIC PANEL
Anion gap: 9 (ref 5–15)
BUN: 11 mg/dL (ref 6–20)
CHLORIDE: 103 mmol/L (ref 98–111)
CO2: 27 mmol/L (ref 22–32)
CREATININE: 1.1 mg/dL (ref 0.61–1.24)
Calcium: 9.4 mg/dL (ref 8.9–10.3)
GFR calc non Af Amer: >60 mL/min (ref >60)
GLUCOSE: 107 mg/dL — AB (ref 70–99)
Potassium: 3.9 mmol/L (ref 3.5–5.1)
SODIUM: 139 mmol/L (ref 135–145)

## 2018-09-22 LAB — CBC
HEMATOCRIT: 42.8 % (ref 39.0–52.0)
HEMOGLOBIN: 15.1 g/dL (ref 13.0–17.0)
MCH: 30.1 pg (ref 26.0–34.0)
MCHC: 35.3 g/dL (ref 30.0–36.0)
MCV: 85.3 fL (ref 80.0–100.0)
Platelets: 240 10*3/uL (ref 150–400)
RBC: 5.02 MIL/uL (ref 4.22–5.81)
RDW: 11.9 % (ref 11.5–15.5)
WBC: 9.4 10*3/uL (ref 4.0–10.5)
nRBC: 0 % (ref 0.0–0.2)

## 2018-09-22 LAB — TROPONIN I: Troponin I: <0.03 ng/mL (ref <0.03)

## 2018-09-22 NOTE — ED Notes (Signed)
Just as pt was discharged out of the computer, pt came back up to the desk requesting to be seen.

## 2018-09-22 NOTE — ED Provider Notes (Addendum)
MSE was initiated and I personally evaluated the patient and placed orders (if any) at  2:05 PM on September 22, 2018.  Based on CC will initiated cardiac evaluation.  EKG withiout evidence of acute ischemia.  Trop negative.  Walking in NAD.  Patient refusing workup or telemetry monitoring.   ED ECG REPORT I, Willy Eddy, the attending physician, personally viewed and interpreted this ECG.   Date: 09/22/2018  EKG Time: 12:58  Rate: 115  Rhythm: sinus  Axis: normal  Intervals: normal  ST&T Change:non specific st abn   The patient appears stable so that the remainder of the MSE may be completed by another provider.   Willy Eddy, MD 09/22/18 1406    Willy Eddy, MD 09/22/18 [phone]

## 2018-09-22 NOTE — ED Notes (Signed)
Did not come back - d/c again

## 2018-09-22 NOTE — ED Triage Notes (Signed)
PT arrived via POV c/o chest pain that started last night. Chest pain is on the left side of the chest radiating down the left arm going numb a few times. Pt denies cardiac history. Pt states that he thinks he is overdoing on something but he does not know what. Pt takes depakote and zyprexa. Pt NAD at present, respirations even and non labored.

## 2018-09-22 NOTE — ED Notes (Signed)
Pt roomed to bed 12, declined w/c, ambulatory, pt asked to dress into gown, pt refused, pt informed of need to use the cardiac monitor for further eval, pt refused, states I'm fine
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
## 2019-04-17 ENCOUNTER — Emergency Department
Admission: EM | Admit: 2019-04-17 | Discharge: 2019-04-18 | Disposition: A | Discharge status: Other Acute Inpatient Hospital | Payer: Self-pay | Attending: Emergency Medicine | Admitting: Emergency Medicine

## 2019-04-17 ENCOUNTER — Emergency Department
Admission: EM | Admit: 2019-04-17 | Discharge: 2019-04-17 | Disposition: A | Discharge status: Home / Self Care | Payer: Self-pay | Attending: Emergency Medicine | Admitting: Emergency Medicine

## 2019-04-17 ENCOUNTER — Other Ambulatory Visit: Payer: Self-pay

## 2019-04-17 ENCOUNTER — Emergency Department: Payer: Self-pay

## 2019-04-17 ENCOUNTER — Encounter: Payer: Self-pay | Admitting: Emergency Medicine

## 2019-04-17 DIAGNOSIS — Z20828 Contact with and (suspected) exposure to other viral communicable diseases: Secondary | ICD-10-CM | POA: Insufficient documentation

## 2019-04-17 DIAGNOSIS — R109 Unspecified abdominal pain: Secondary | ICD-10-CM

## 2019-04-17 DIAGNOSIS — F209 Schizophrenia, unspecified: Secondary | ICD-10-CM | POA: Insufficient documentation

## 2019-04-17 DIAGNOSIS — K5900 Constipation, unspecified: Secondary | ICD-10-CM | POA: Insufficient documentation

## 2019-04-17 DIAGNOSIS — F17210 Nicotine dependence, cigarettes, uncomplicated: Secondary | ICD-10-CM | POA: Insufficient documentation

## 2019-04-17 DIAGNOSIS — R1010 Upper abdominal pain, unspecified: Secondary | ICD-10-CM

## 2019-04-17 DIAGNOSIS — F312 Bipolar disorder, current episode manic severe with psychotic features: Secondary | ICD-10-CM | POA: Diagnosis present

## 2019-04-17 DIAGNOSIS — Z79899 Other long term (current) drug therapy: Secondary | ICD-10-CM | POA: Insufficient documentation

## 2019-04-17 DIAGNOSIS — Z9114 Patient's other noncompliance with medication regimen: Secondary | ICD-10-CM | POA: Insufficient documentation

## 2019-04-17 DIAGNOSIS — F1121 Opioid dependence, in remission: Secondary | ICD-10-CM | POA: Diagnosis present

## 2019-04-17 DIAGNOSIS — R45850 Homicidal ideations: Secondary | ICD-10-CM

## 2019-04-17 LAB — COMPREHENSIVE METABOLIC PANEL
ALT: 12 U/L (ref 0–44)
AST: 17 U/L (ref 15–41)
Albumin: 4.4 g/dL (ref 3.5–5.0)
Alkaline Phosphatase: 59 U/L (ref 38–126)
Anion gap: 11 (ref 5–15)
BUN: 12 mg/dL (ref 6–20)
CO2: 26 mmol/L (ref 22–32)
Calcium: 8.6 mg/dL — ABNORMAL LOW (ref 8.9–10.3)
Chloride: 101 mmol/L (ref 98–111)
Creatinine, Ser: 0.8 mg/dL (ref 0.61–1.24)
GFR calc Af Amer: >60 mL/min (ref >60)
GFR calc non Af Amer: >60 mL/min (ref >60)
Glucose, Bld: 103 mg/dL — ABNORMAL HIGH (ref 70–99)
Potassium: 3.4 mmol/L — ABNORMAL LOW (ref 3.5–5.1)
Sodium: 138 mmol/L (ref 135–145)
Total Bilirubin: 0.9 mg/dL (ref 0.3–1.2)
Total Protein: 7 g/dL (ref 6.5–8.1)

## 2019-04-17 LAB — CBC
HCT: 44.3 % (ref 39.0–52.0)
Hemoglobin: 15.9 g/dL (ref 13.0–17.0)
MCH: 29.8 pg (ref 26.0–34.0)
MCHC: 35.9 g/dL (ref 30.0–36.0)
MCV: 83.1 fL (ref 80.0–100.0)
Platelets: 229 10*3/uL (ref 150–400)
RBC: 5.33 MIL/uL (ref 4.22–5.81)
RDW: 11.8 % (ref 11.5–15.5)
WBC: 8.3 10*3/uL (ref 4.0–10.5)
nRBC: 0 % (ref 0.0–0.2)

## 2019-04-17 LAB — LIPASE, BLOOD: Lipase: 19 U/L (ref 11–51)

## 2019-04-17 LAB — ETHANOL: Alcohol, Ethyl (B): <10 mg/dL (ref <10)

## 2019-04-17 MED ORDER — LACTULOSE 10 GM/15ML PO SOLN
30.0000 g | Freq: Once | ORAL | Status: DC
  Filled 2019-04-17: qty 60

## 2019-04-17 MED ORDER — FAMOTIDINE IN NACL 20-0.9 MG/50ML-% IV SOLN
20.0000 mg | Freq: Once | INTRAVENOUS | Status: AC
  Administered 2019-04-17: 04:00:00 20 mg via INTRAVENOUS
  Filled 2019-04-17: qty 50

## 2019-04-17 MED ORDER — LACTULOSE 10 GM/15ML PO SOLN: 20.0000 g | Freq: Every day | ORAL | 0 refills | Status: DC | PRN

## 2019-04-17 MED ORDER — DICYCLOMINE HCL 20 MG PO TABS
20.0000 mg | ORAL_TABLET | Freq: Once | ORAL | Status: AC
  Administered 2019-04-17: 04:00:00 20 mg via ORAL
  Filled 2019-04-17: qty 1

## 2019-04-17 MED ORDER — DICYCLOMINE HCL 20 MG PO TABS: 20.0000 mg | ORAL_TABLET | Freq: Four times a day (QID) | ORAL | 0 refills | Status: DC | PRN
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
## 2019-04-17 NOTE — ED Triage Notes (Signed)
PT seen and d/c today with same complaints of LLQ abdominal pain. PT states he is here for a second opinion. Pt states "I have a blockage." Pt presents XRAYS taken today. Pt states there are no new complaints or symptoms. Pt states he is in no pain while sitting still, pain in the LLQ is present with movement and palpation.

## 2019-04-17 NOTE — ED Notes (Signed)
Per MD Quentin Cornwall, no protocols at this time

## 2019-04-17 NOTE — ED Triage Notes (Signed)
Pt arrives to ED via POV from home with c/o non-radiating LUQ abdominal pain for "could be a week, could be two. I just started noticing it". Pt reports 1 episode of vomiting 2hrs PTA. Pt denies any fever, SHOB, or CP. Pt states he took some OTC MOM for his s/x's and vomited it up 2 hrs ago. No urinary s/x's or c/o's. Pt is A&O, in NAD; RR even, regular, and unlabored.

## 2019-04-17 NOTE — ED Notes (Signed)
Patient c/o poor appetite and poor PO intake. Patient states his breath smells like stool. Patient brought his x-rays here and states "it clearly shows solid waste. You don't have to have a degree in radiology to see that." Patient agreed to keep pulse ox on, but took blood pressure cuff off.

## 2019-04-17 NOTE — Discharge Instructions (Addendum)
1.  Take Lactulose as needed for bowel movements. 2.  You may take Bentyl as needed for abdominal discomfort. 3.  Drink plenty of fluids daily. 4.  Return to the ER for worsening symptoms, persistent vomiting, difficulty breathing or other concerns.

## 2019-04-17 NOTE — ED Provider Notes (Signed)
[facility] Emergency Department Provider Note   ____________________________________________   First MD Initiated Contact with Patient 04/17/19 0402     (approximate)  I have reviewed the triage vital signs and the nursing notes.   HISTORY  Chief Complaint Abdominal Pain  Limited by vague historian  HPI Brandon Dodson is a 38 y.o. male who presents to the ED from home with a chief complaint of abdominal pain.  Patient is very vague in his history, but as I understand it, he has been having a 1 to 2-week period of left upper quadrant abdominal pain.  States it has been several weeks since his last bowel movement.  Feels like he has a "blockage".  Denies fever, cough, chest pain, shortness of breath, nausea, vomiting.  States he took some milk of magnesia prior to arrival and vomited that up. Denies recent travel, trauma or exposure to persons diagnosed with Coronavirus.       Past Medical History Bipolar disorder I   Patient Active Problem List   Diagnosis Date Noted  . Opioid use disorder, moderate, in early remission, on maintenance therapy, dependence (HCC) 06/18/2018  . Bipolar I disorder, current or most recent episode manic, with psychotic features (HCC) 05/31/2017  . Tobacco use disorder 05/31/2017    Past Surgical History:  Procedure Laterality Date  . DENTAL SURGERY      Prior to Admission medications   Medication Sig Start Date End Date Taking? Authorizing Provider  dicyclomine (BENTYL) 20 MG tablet Take 1 tablet (20 mg total) by mouth every 6 (six) hours as needed. 04/17/19   Irean HongSung, Kairi Harshbarger J, MD  divalproex (DEPAKOTE) 250 MG DR tablet Take 3 tablets (750 mg total) by mouth every 12 (twelve) hours. 06/24/18   [name], [name], MD  lactulose (CHRONULAC) 10 GM/15ML solution Take 30 mLs (20 g total) by mouth daily as needed for mild constipation. 04/17/19   Irean HongSung, Danelia Snodgrass J, MD  OLANZapine (ZYPREXA) 15 MG tablet Take 2 tablets (30 mg total)  by mouth at bedtime. 06/24/18   [name], Ellin GoodieJolanta B, MD    Allergies Patient has no known allergies.  No family history on file.  Social History Social History   Tobacco Use  . Smoking status: Current Every Day Smoker    Packs/day: 1.00    Types: Cigarettes  . Smokeless tobacco: Never Used  Substance Use Topics  . Alcohol use: Yes  . Drug use: Not on file    Review of Systems  Constitutional: No fever/chills Eyes: No visual changes. ENT: No sore throat. Cardiovascular: Denies chest pain. Respiratory: Denies shortness of breath. Gastrointestinal: Positive for abdominal pain.  No nausea, no vomiting.  No diarrhea.  Positive for constipation. Genitourinary: Negative for dysuria. Musculoskeletal: Negative for back pain. Skin: Negative for rash. Neurological: Negative for headaches, focal weakness or numbness.   ____________________________________________   PHYSICAL EXAM:  VITAL SIGNS: ED Triage Vitals  Enc Vitals Group     BP 04/17/19 0350 128/79     Pulse Rate 04/17/19 0350 83     Resp 04/17/19 0350 17     Temp 04/17/19 0350 98.9 F (37.2 C)     Temp Source 04/17/19 0350 Oral     SpO2 04/17/19 0350 97 %     Weight 04/17/19 0347 163 lb (73.9 kg)     Height 04/17/19 0347 5\' 9"  (1.753 m)     Head Circumference --      Peak Flow --  Pain Score 04/17/19 0346 0     Pain Loc --      Pain Edu? --      Excl. in [place]? --     Constitutional: Alert and oriented. Well appearing and in no acute distress. Eyes: Conjunctivae are normal. PERRL. EOMI. Head: Atraumatic. Nose: No congestion/rhinnorhea. Mouth/Throat: Mucous membranes are moist.  Oropharynx non-erythematous. Neck: No stridor.   Cardiovascular: Normal rate, regular rhythm. Grossly normal heart sounds.  Good peripheral circulation. Respiratory: Normal respiratory effort.  No retractions. Lungs CTAB. Gastrointestinal: Soft and nontender to light and deep palpation. No distention. No abdominal bruits. No  CVA tenderness. Musculoskeletal: No lower extremity tenderness nor edema.  No joint effusions. Neurologic:  Normal speech and language. No gross focal neurologic deficits are appreciated. No gait instability. Skin:  Skin is warm, dry and intact. No rash noted. Psychiatric: Mood and affect are flat. Speech and behavior are normal.  ____________________________________________   LABS (all labs ordered are listed, but only abnormal results are displayed)  Labs Reviewed  COMPREHENSIVE METABOLIC PANEL - Abnormal; Notable for the following components:      Result Value   Potassium 3.4 (*)    Glucose, Bld 103 (*)    Calcium 8.6 (*)    All other components within normal limits  LIPASE, BLOOD  CBC  ETHANOL  URINALYSIS, COMPLETE (UACMP) WITH MICROSCOPIC   ____________________________________________  EKG  None ____________________________________________  RADIOLOGY  ED MD interpretation: Moderate stool burden, air-fluid levels  Official radiology report(s): Dg Abd Acute W/chest  Result Date: 04/17/2019 CLINICAL DATA:  Abdominal pain and constipation EXAM: DG ABDOMEN ACUTE W/ 1V CHEST COMPARISON:  None. FINDINGS: Normal heart size and mediastinal contours. No acute infiltrate or edema. No effusion or pneumothorax. No acute osseous findings. Nonobstructive bowel gas pattern. Colonic fluid levels are seen to the level of the splenic flexure, suggesting a diarrheal illness. History of constipation but limited stool is seen. No concerning mass effect calcification when accounting for right pelvic phleboliths. IMPRESSION: 1. No stool retention to correlate with history of constipation. 2. Fluid levels within the nondilated colon suggesting diarrheal illness. Electronically Signed   By: Monte Fantasia M.D.   On: 04/17/2019 04:55    ____________________________________________   PROCEDURES  Procedure(s) performed (including Critical Care):  Procedures    ____________________________________________   INITIAL IMPRESSION / ASSESSMENT AND PLAN / ED COURSE  As part of my medical decision making, I reviewed the following data within the [facility] notes reviewed and incorporated, Labs reviewed, Old chart reviewed, Radiograph reviewed and Notes from prior ED visits     Nuri Larmer was evaluated in Emergency Department on 04/17/2019 for the symptoms described in the history of present illness. He was evaluated in the context of the global COVID-19 pandemic, which necessitated consideration that the patient might be at risk for infection with the SARS-CoV-2 virus that causes COVID-19. Institutional protocols and algorithms that pertain to the evaluation of patients at risk for COVID-19 are in a state of rapid change based on information released by regulatory bodies including the CDC and federal and state organizations. These policies and algorithms were followed during the patient's care in the ED.   38 year old male who presents to the ED for a 1-2 week history of upper abdominal pain. Differential diagnosis includes, but is not limited to, biliary disease (biliary colic, acute cholecystitis, cholangitis, choledocholithiasis, etc), intrathoracic causes for epigastric abdominal pain including ACS, gastritis, duodenitis, pancreatitis, small bowel or large bowel  obstruction, abdominal aortic aneurysm, hernia, and ulcer(s).  Will obtain labwork, abdominal xrays and reassess.  Abdomen is benign; clinically do not suspect SBO, more likely ileus.   Clinical Course as of Apr 17 519  Thu Apr 17, 2019  0516 Updated patient on all test results.  Will discharge home with prescriptions for Lactulose and Bentyl.  Strict return precautions given.  Patient verbalizes understanding agrees with plan of care.   [JS]    Clinical Course User Index [JS] Irean HongSung, Ersel Enslin J, MD     ____________________________________________   FINAL  CLINICAL IMPRESSION(S) / ED DIAGNOSES  Final diagnoses:  Pain of upper abdomen  Constipation, unspecified constipation type     ED Discharge Orders         Ordered    dicyclomine (BENTYL) 20 MG tablet  Every 6 hours PRN     04/17/19 0517    lactulose (CHRONULAC) 10 GM/15ML solution  Daily PRN     04/17/19 0517           Note:  This document was prepared using Dragon voice recognition software and may include unintentional dictation errors.   Irean HongSung, Tashema Tiller J, MD 04/17/19 [phone]

## 2019-04-17 NOTE — ED Notes (Signed)
Patient transported to X-ray 

## 2019-04-17 NOTE — ED Notes (Addendum)
Pt ambulatory to STAT desk stating he feels like he was "shot 5 times" in the stomach; pt is agitated with registration, st "what's wrong with you I done told you this stuff"; ED lobby officer informs pt that registration is only performing her job by verifying his information; pt st "she already heard me one time, how many times I gotta say it!"

## 2019-04-17 NOTE — ED Notes (Signed)
Patient has not filled prescriptions from last ED visit. Patient states he doesn't have the money and he doesn't believe that the medicines would be effective. Patient has a coke bottle at bedside that is 3/4 empty.

## 2019-04-18 ENCOUNTER — Inpatient Hospital Stay
Admission: AD | Admit: 2019-04-18 | Discharge: 2019-04-22 | DRG: 885 | Disposition: A | Discharge status: Home / Self Care | Payer: No Typology Code available for payment source | Source: Intra-hospital | Attending: Psychiatry | Admitting: Psychiatry

## 2019-04-18 ENCOUNTER — Other Ambulatory Visit: Payer: Self-pay

## 2019-04-18 ENCOUNTER — Encounter: Payer: Self-pay | Admitting: Psychiatry

## 2019-04-18 DIAGNOSIS — Z20828 Contact with and (suspected) exposure to other viral communicable diseases: Secondary | ICD-10-CM | POA: Diagnosis present

## 2019-04-18 DIAGNOSIS — K5900 Constipation, unspecified: Secondary | ICD-10-CM | POA: Diagnosis present

## 2019-04-18 DIAGNOSIS — F1510 Other stimulant abuse, uncomplicated: Secondary | ICD-10-CM | POA: Diagnosis present

## 2019-04-18 DIAGNOSIS — Z590 Homelessness: Secondary | ICD-10-CM | POA: Diagnosis not present

## 2019-04-18 DIAGNOSIS — F3110 Bipolar disorder, current episode manic without psychotic features, unspecified: Secondary | ICD-10-CM | POA: Diagnosis present

## 2019-04-18 DIAGNOSIS — F17210 Nicotine dependence, cigarettes, uncomplicated: Secondary | ICD-10-CM | POA: Diagnosis present

## 2019-04-18 DIAGNOSIS — F319 Bipolar disorder, unspecified: Secondary | ICD-10-CM | POA: Diagnosis present

## 2019-04-18 DIAGNOSIS — Z9119 Patient's noncompliance with other medical treatment and regimen: Secondary | ICD-10-CM | POA: Diagnosis not present

## 2019-04-18 DIAGNOSIS — G3184 Mild cognitive impairment, so stated: Secondary | ICD-10-CM | POA: Diagnosis present

## 2019-04-18 DIAGNOSIS — G4700 Insomnia, unspecified: Secondary | ICD-10-CM | POA: Diagnosis present

## 2019-04-18 DIAGNOSIS — Z91199 Patient's noncompliance with other medical treatment and regimen due to unspecified reason: Secondary | ICD-10-CM

## 2019-04-18 LAB — URINALYSIS, COMPLETE (UACMP) WITH MICROSCOPIC
Bacteria, UA: NONE SEEN
Bilirubin Urine: NEGATIVE
Glucose, UA: NEGATIVE mg/dL
Hgb urine dipstick: NEGATIVE
Ketones, ur: 20 mg/dL — AB
Leukocytes,Ua: NEGATIVE
Nitrite: NEGATIVE
Protein, ur: NEGATIVE mg/dL
Specific Gravity, Urine: 1.009 (ref 1.005–1.030)
Squamous Epithelial / LPF: NONE SEEN (ref 0–5)
pH: 5 (ref 5.0–8.0)

## 2019-04-18 LAB — CBC WITH DIFFERENTIAL/PLATELET
Abs Immature Granulocytes: 0.03 10*3/uL (ref 0.00–0.07)
Basophils Absolute: 0 10*3/uL (ref 0.0–0.1)
Basophils Relative: 0 %
Eosinophils Absolute: 0 10*3/uL (ref 0.0–0.5)
Eosinophils Relative: 0 %
HCT: 42.8 % (ref 39.0–52.0)
Hemoglobin: 15.4 g/dL (ref 13.0–17.0)
Immature Granulocytes: 0 %
Lymphocytes Relative: 14 %
Lymphs Abs: 1.4 10*3/uL (ref 0.7–4.0)
MCH: 30.6 pg (ref 26.0–34.0)
MCHC: 36 g/dL (ref 30.0–36.0)
MCV: 85.1 fL (ref 80.0–100.0)
Monocytes Absolute: 0.3 10*3/uL (ref 0.1–1.0)
Monocytes Relative: 3 %
Neutro Abs: 7.9 10*3/uL — ABNORMAL HIGH (ref 1.7–7.7)
Neutrophils Relative %: 83 %
Platelets: 194 10*3/uL (ref 150–400)
RBC: 5.03 MIL/uL (ref 4.22–5.81)
RDW: 11.9 % (ref 11.5–15.5)
WBC: 9.7 10*3/uL (ref 4.0–10.5)
nRBC: 0 % (ref 0.0–0.2)

## 2019-04-18 LAB — ACETAMINOPHEN LEVEL: Acetaminophen (Tylenol), Serum: <10 ug/mL — ABNORMAL LOW (ref 10–30)

## 2019-04-18 LAB — URINE DRUG SCREEN, QUALITATIVE (ARMC ONLY)
Amphetamines, Ur Screen: POSITIVE — AB
Barbiturates, Ur Screen: NOT DETECTED
Benzodiazepine, Ur Scrn: NOT DETECTED
Cannabinoid 50 Ng, Ur ~~LOC~~: NOT DETECTED
Cocaine Metabolite,Ur ~~LOC~~: NOT DETECTED
MDMA (Ecstasy)Ur Screen: NOT DETECTED
Methadone Scn, Ur: NOT DETECTED
Opiate, Ur Screen: NOT DETECTED
Phencyclidine (PCP) Ur S: NOT DETECTED
Tricyclic, Ur Screen: NOT DETECTED

## 2019-04-18 LAB — COMPREHENSIVE METABOLIC PANEL
ALT: 13 U/L (ref 0–44)
AST: 18 U/L (ref 15–41)
Albumin: 4.4 g/dL (ref 3.5–5.0)
Alkaline Phosphatase: 64 U/L (ref 38–126)
Anion gap: 9 (ref 5–15)
BUN: 11 mg/dL (ref 6–20)
CO2: 26 mmol/L (ref 22–32)
Calcium: 8.6 mg/dL — ABNORMAL LOW (ref 8.9–10.3)
Chloride: 101 mmol/L (ref 98–111)
Creatinine, Ser: 0.85 mg/dL (ref 0.61–1.24)
GFR calc Af Amer: >60 mL/min (ref >60)
GFR calc non Af Amer: >60 mL/min (ref >60)
Glucose, Bld: 162 mg/dL — ABNORMAL HIGH (ref 70–99)
Potassium: 3.4 mmol/L — ABNORMAL LOW (ref 3.5–5.1)
Sodium: 136 mmol/L (ref 135–145)
Total Bilirubin: 0.9 mg/dL (ref 0.3–1.2)
Total Protein: 6.9 g/dL (ref 6.5–8.1)

## 2019-04-18 LAB — ETHANOL: Alcohol, Ethyl (B): <10 mg/dL (ref <10)

## 2019-04-18 LAB — SALICYLATE LEVEL: Salicylate Lvl: <7 mg/dL (ref 2.8–30.0)

## 2019-04-18 LAB — VALPROIC ACID LEVEL: Valproic Acid Lvl: <10 ug/mL — ABNORMAL LOW (ref 50.0–100.0)

## 2019-04-18 LAB — SARS CORONAVIRUS 2 BY RT PCR (HOSPITAL ORDER, PERFORMED IN ~~LOC~~ HOSPITAL LAB): SARS Coronavirus 2: NEGATIVE

## 2019-04-18 MED ORDER — OLANZAPINE 10 MG PO TABS
10.0000 mg | ORAL_TABLET | Freq: Every day | ORAL | Status: DC
  Administered 2019-04-18: 10 mg via ORAL
  Filled 2019-04-18: qty 1

## 2019-04-18 MED ORDER — DIVALPROEX SODIUM 500 MG PO DR TAB
500.0000 mg | DELAYED_RELEASE_TABLET | Freq: Two times a day (BID) | ORAL | Status: DC
  Administered 2019-04-18: 500 mg via ORAL
  Filled 2019-04-18: qty 1

## 2019-04-18 MED ORDER — HYDROXYZINE HCL 25 MG PO TABS
25.0000 mg | ORAL_TABLET | Freq: Three times a day (TID) | ORAL | Status: DC | PRN
  Administered 2019-04-20 – 2019-04-22 (×3): 25 mg via ORAL
  Filled 2019-04-18 (×3): qty 1

## 2019-04-18 MED ORDER — OLANZAPINE 5 MG PO TABS
5.0000 mg | ORAL_TABLET | Freq: Once | ORAL | Status: AC
  Administered 2019-04-18: 5 mg via ORAL
  Filled 2019-04-18: qty 1

## 2019-04-18 MED ORDER — MAGNESIUM HYDROXIDE 400 MG/5ML PO SUSP: 30.0000 mL | Freq: Every day | ORAL | Status: DC | PRN

## 2019-04-18 MED ORDER — OLANZAPINE 10 MG PO TABS: 10.0000 mg | ORAL_TABLET | Freq: Every day | ORAL | Status: DC

## 2019-04-18 MED ORDER — ALUM & MAG HYDROXIDE-SIMETH 200-200-20 MG/5ML PO SUSP: 30.0000 mL | ORAL | Status: DC | PRN

## 2019-04-18 MED ORDER — DIVALPROEX SODIUM 500 MG PO DR TAB
500.0000 mg | DELAYED_RELEASE_TABLET | Freq: Two times a day (BID) | ORAL | Status: DC
  Administered 2019-04-18 – 2019-04-19 (×2): 500 mg via ORAL
  Filled 2019-04-18 (×2): qty 1

## 2019-04-18 MED ORDER — ACETAMINOPHEN 325 MG PO TABS: 650.0000 mg | ORAL_TABLET | Freq: Four times a day (QID) | ORAL | Status: DC | PRN

## 2019-04-18 MED ORDER — TRAZODONE HCL 50 MG PO TABS
50.0000 mg | ORAL_TABLET | Freq: Every evening | ORAL | Status: DC | PRN
  Administered 2019-04-19 – 2019-04-21 (×3): 50 mg via ORAL
  Filled 2019-04-18 (×3): qty 1
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
## 2019-04-18 NOTE — ED Notes (Signed)
Patient talking with psychiatric NP Darnelle Maffucci

## 2019-04-18 NOTE — ED Notes (Signed)
Hourly rounding reveals patient sleeping in room. No complaints, stable, in no acute distress. Q15 minute rounds and monitoring via Security to continue. 
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
## 2019-04-18 NOTE — ED Notes (Signed)
Patient woke up agitated because he had stuck some toilet paper in his ears as ear plugs from noise, patient came out of room and said to writer and EDT "somebody get me some damn tweezers to get this paper out of my ears". Writer informed him that I will let the MD know, patient said well hurry the fuck up! Patient felt the doctor was taking to long and became increasingly agitated and began asking any staff member that passed to bring him damn some tweezers, Probation officer informed him that we have children on the unit to please watch his language and also that the doctor is aware and will be here as soon as he can.  MD [facility] notified and saw patient

## 2019-04-18 NOTE — Consult Note (Signed)
I was contacted by the nurse in the ED about the patient becoming continually agitated.  Reviewed patient's chart and per patient's home medication list he was on Depakote DR 750 mg every 12 hours and Zyprexa 30 mg p.o. nightly.  Patient reported that he has not been taking his medications for quite some time and then stated 90 days.  We will restart medications at lower doses.  I have ordered a valproic acid level to verify whether the patient has been taking his medications.  I also ordered Zyprexa 5 mg p.o. once and Zyprexa 10 mg p.o. nightly.  Once the valproic acid level has resulted I have ordered Depakote DR 500 mg p.o. every 12 hours

## 2019-04-18 NOTE — BH Assessment (Signed)
Patient is to be admitted to [facility]. Attending Physician will be Dr. Weber Cooks.   Patient has been assigned to room 319, by [place].   ER staff is aware of the admission:  Vaughan Basta, ER Secretary    Dr. Cinda Quest, ER MD   Donneta Romberg, Patient's Nurse   Gust Rung., Patient Access.

## 2019-04-18 NOTE — ED Provider Notes (Signed)
-----------------------------------------   4:26 AM on 04/18/2019 -----------------------------------------   Blood pressure 111/69, pulse 60, temperature 98.3 F (36.8 C), temperature source Oral, resp. rate 16, height 5\' 9"  (1.753 m), weight 74.8 kg, SpO2 99 %.  The patient is calm and cooperative at this time.  There have been no acute events since the last update.  He was evaluated by the psychiatric NP who recommends hospitalization.   Paulette Blanch, MD 04/18/19 [phone]
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
## 2019-04-18 NOTE — ED Notes (Signed)
Report given to Anne RN

## 2019-04-18 NOTE — Progress Notes (Signed)
Patient presents involuntarily secondary to increased thoughts of self harm. Patient has been experiencing relationship difficulties. Wife cheated on him and planning to leave him. Patient experiencing difficulties coping with the stress. Threatening to shoot himself. Upon admission, patient is agitated and resistant to care. Presents with history of Bipolar and opioid abuse and has been off medications for a couple of weeks. Patient is denial and minimizing his stressors, reporting that "I am OK".  Reports feeling tired and wanting to go straight to bed. Skin assessment performed by this writer staffed with Megan, RN. No skins concerns noted. Admitted and oriented to the unit. Safety precautions initiated.  

## 2019-04-18 NOTE — Tx Team (Signed)
Patient presents involuntarily secondary to increased thoughts of self harm. Patient has been experiencing relationship difficulties. Wife cheated on him and planning to leave him. Patient experiencing difficulties coping with the stress. Threatening to shoot himself. Upon admission, patient is agitated and resistant to care. Presents with history of Bipolar and opioid abuse and has been off medications for a couple of weeks. Patient is denial and minimizing his stressors, reporting that "I am OK".  Reports feeling tired and wanting to go straight to bed. Skin assessment performed by this Probation officer staffed with Jinny Blossom, RN. No skins concerns noted. Admitted and oriented to the unit. Safety precautions initiated.

## 2019-04-18 NOTE — ED Notes (Signed)
Pt dressed out by Lonn Georgia and this tech.

## 2019-04-18 NOTE — Tx Team (Signed)
Initial Treatment Plan 04/18/2019 7:05 PM Brandon Dodson [id number]    PATIENT STRESSORS: Loss of Relationship Marital or family conflict Substance abuse   PATIENT STRENGTHS: Ability for insight Average or above average intelligence Physical Health   PATIENT IDENTIFIED PROBLEMS: Depression  Suicidal ideations  Medication non compliance  Loss of a relationship               DISCHARGE CRITERIA:  Improved stabilization in mood, thinking, and/or behavior Motivation to continue treatment in a less acute level of care Safe-care adequate arrangements made Verbal commitment to aftercare and medication compliance  PRELIMINARY DISCHARGE PLAN: Outpatient therapy Return to previous living arrangement Return to previous work or school arrangements  PATIENT/FAMILY INVOLVEMENT: This treatment plan has been presented to and reviewed with the patient, Brandon Dodson.  The patient has been given the opportunity to ask questions and make suggestions.  Ronelle Nigh, RN 04/18/2019, 7:05 PM

## 2019-04-19 DIAGNOSIS — Z91199 Patient's noncompliance with other medical treatment and regimen due to unspecified reason: Secondary | ICD-10-CM

## 2019-04-19 DIAGNOSIS — K5900 Constipation, unspecified: Secondary | ICD-10-CM

## 2019-04-19 DIAGNOSIS — Z9119 Patient's noncompliance with other medical treatment and regimen: Secondary | ICD-10-CM

## 2019-04-19 DIAGNOSIS — F3110 Bipolar disorder, current episode manic without psychotic features, unspecified: Secondary | ICD-10-CM

## 2019-04-19 DIAGNOSIS — F1510 Other stimulant abuse, uncomplicated: Secondary | ICD-10-CM

## 2019-04-19 MED ORDER — POLYETHYLENE GLYCOL 3350 17 G PO PACK
17.0000 g | PACK | Freq: Every day | ORAL | Status: DC
  Administered 2019-04-19 – 2019-04-20 (×2): 17 g via ORAL
  Filled 2019-04-19 (×3): qty 1

## 2019-04-19 MED ORDER — PALIPERIDONE ER 3 MG PO TB24
6.0000 mg | ORAL_TABLET | Freq: Every day | ORAL | Status: DC
  Administered 2019-04-19 – 2019-04-21 (×3): 6 mg via ORAL
  Filled 2019-04-19 (×3): qty 2

## 2019-04-19 MED ORDER — MAGNESIUM CITRATE PO SOLN
1.0000 | Freq: Every day | ORAL | Status: DC | PRN
  Filled 2019-04-19: qty 296

## 2019-04-19 MED ORDER — DOCUSATE SODIUM 100 MG PO CAPS
100.0000 mg | ORAL_CAPSULE | Freq: Two times a day (BID) | ORAL | Status: DC
  Administered 2019-04-19 – 2019-04-20 (×2): 100 mg via ORAL
  Filled 2019-04-19 (×2): qty 1

## 2019-04-19 NOTE — [facility] Group Notes (Signed)
LCSW Group Therapy Note  04/19/2019 1:15pm  Type of Therapy and Topic:  Group Therapy:  Cognitive Distortions  Participation Level:  Did Not Attend   Description of Group:    Patients in this group will be introduced to the topic of cognitive distortions.  Patients will identify and describe cognitive distortions, describe the feelings these distortions create for them.  Patients will identify one or more situations in their personal life where they have cognitively distorted thinking and will verbalize challenging this cognitive distortion through positive thinking skills.  Patients will practice the skill of using positive affirmations to challenge cognitive distortions using affirmation cards.    Therapeutic Goals:  1. Patient will identify two or more cognitive distortions they have used 2. Patient will identify one or more emotions that stem from use of a cognitive distortion 3. Patient will demonstrate use of a positive affirmation to counter a cognitive distortion through discussion and/or role play. 4. Patient will describe one way cognitive distortions can be detrimental to wellness   Summary of Patient Progress: Pt was invited to attend group but chose not to attend. CSW will continue to encourage pt to attend group throughout their admission.      Therapeutic Modalities:   Cognitive Behavioral Therapy Motivational Interviewing   Leslee Haueter  CUEBAS-COLON, LCSW 04/19/2019 11:19 AM    

## 2019-04-19 NOTE — Plan of Care (Signed)
  Problem: Education: Goal: Mental status will improve Outcome: Progressing  Patient appears less anxious and he is coherent.

## 2019-04-19 NOTE — Plan of Care (Signed)
Patient aware of information received , able to verbalize understanding .  Emotional and mental status improved . Encourage patient  to work on stress with anxiety  coping  skills and decision making  in therapy group.  Compliant with medication . Patient denies  suicidal ideation . Problem: Self-Concept: Goal: Ability to disclose and discuss suicidal ideas will improve Outcome: Progressing   Problem: Medication: Goal: Compliance with prescribed medication regimen will improve 04/19/2019 1048 by Leodis Liverpool, RN Outcome: Progressing 04/19/2019 1007 by Leodis Liverpool, RN Outcome: Progressing   Problem: Coping: Goal: Coping ability will improve 04/19/2019 1048 by Leodis Liverpool, RN Outcome: Progressing 04/19/2019 1007 by Leodis Liverpool, RN Outcome: Progressing   Problem: Education: Goal: Ability to make informed decisions regarding treatment will improve 04/19/2019 1048 by Leodis Liverpool, RN Outcome: Progressing 04/19/2019 1007 by Leodis Liverpool, RN Outcome: Progressing   Problem: Self-Concept: Goal: Ability to identify factors that promote anxiety will improve 04/19/2019 1048 by Leodis Liverpool, RN Outcome: Progressing 04/19/2019 1007 by Leodis Liverpool, RN Outcome: Progressing   Problem: Education: Goal: Ability to state activities that reduce stress will improve Outcome: Progressing   Problem: Education: Goal: Emotional status will improve 04/19/2019 1048 by Leodis Liverpool, RN Outcome: Progressing 04/19/2019 1007 by Leodis Liverpool, RN Outcome: Progressing   Problem: Education: Goal: Knowledge of [place] Education information/materials will improve 04/19/2019 1048 by Leodis Liverpool, RN Outcome: Progressing 04/19/2019 1007 by Leodis Liverpool, RN Outcome: Progressing

## 2019-04-19 NOTE — H&P (Signed)
Psychiatric Admission Assessment Adult  Patient Identification: Brandon Dodson MRN:  [id number] Date of Evaluation:  04/19/2019 Chief Complaint:  bipolar Principal Diagnosis: Bipolar I disorder, most recent episode (or current) manic (HCC) Diagnosis:  Principal Problem:   Bipolar I disorder, most recent episode (or current) manic (HCC) Active Problems:   Amphetamine abuse (HCC)   Noncompliance   Constipation  History of Present Illness: Patient seen chart reviewed.  Patient is a young man with a history of mental health problems who presented to the emergency room initially complaining of abdominal pain but was noted to be agitated and acting strangely from the very beginning.  Emergency room staff assessed the patient and found that he became more agitated during his time in the emergency room.  Made phone calls to his mother during which it was noted that he made comments about killing people.  On interview today the patient admits that he talked about killing people with his mother.  He has no insight into why this would be a problem and says it is just normal for him.  He denies any specific person that he wants to kill.  Denies any suicidal ideation.  Patient is generally very uncooperative with the interview.  Answers most questions with 1 or 2 words and a roll of his eyes.  Says he only came to the hospital because of constipation.  Admits that he has been using methamphetamine but declines to specify how frequently.  Denies other drug use.  Says he has been homeless but acknowledges that he is neatly groomed and has been able to find some place to stay at times.  Claims to not be working currently.  Will not discuss any of his financial situation.  Denies any hallucinations.  Not compliant with outpatient psychiatric medication. Associated Signs/Symptoms: Depression Symptoms:  insomnia, feelings of worthlessness/guilt, (Hypo) Manic Symptoms:  Distractibility, Impulsivity, Irritable  Mood, Anxiety Symptoms:  Excessive Worry, Psychotic Symptoms:  Paranoia, PTSD Symptoms: Negative Total Time spent with patient: 1 hour  Past Psychiatric History: Patient has a history of several prior hospitalizations.  Patient usually presents with his current mental status which is to say he typically is presenting as uncooperative somewhat defiant flat and dismissive if not outright contemptuous of any attempts to form any contact with him.  History of established substance abuse problems in the past.  Has been on antipsychotic medicines with questionable improvement in the past but does not usually comply with outpatient treatment.  Is the patient at risk to self? No.  Has the patient been a risk to self in the past 6 months? No.  Has the patient been a risk to self within the distant past? Yes.    Is the patient a risk to others? Yes.    Has the patient been a risk to others in the past 6 months? Yes.    Has the patient been a risk to others within the distant past? Yes.     Prior Inpatient Therapy:   Prior Outpatient Therapy:    Alcohol Screening: 1. How often do you have a drink containing alcohol?: Never 2. How many drinks containing alcohol do you have on a typical day when you are drinking?: 1 or 2 3. How often do you have six or more drinks on one occasion?: Never AUDIT-C Score: 0 4. How often during the last year have you found that you were not able to stop drinking once you had started?: Never 5. How often during the last year  have you failed to do what was normally expected from you becasue of drinking?: Never 6. How often during the last year have you needed a first drink in the morning to get yourself going after a heavy drinking session?: Never 7. How often during the last year have you had a feeling of guilt of remorse after drinking?: Never 8. How often during the last year have you been unable to remember what happened the night before because you had been drinking?:  Never 9. Have you or someone else been injured as a result of your drinking?: No 10. Has a relative or friend or a doctor or another health worker been concerned about your drinking or suggested you cut down?: No Alcohol Use Disorder Identification Test Final Score (AUDIT): 0 Alcohol Brief Interventions/Follow-up: Continued Monitoring Substance Abuse History in the last 12 months:  Yes.   Consequences of Substance Abuse: Medical Consequences:  Worsening psychosis poor self-care Previous Psychotropic Medications: Yes  Psychological Evaluations: Yes  Past Medical History: History reviewed. No pertinent past medical history.  Past Surgical History:  Procedure Laterality Date  . DENTAL SURGERY     Family History: History reviewed. No pertinent family history. Family Psychiatric  History: None reported Tobacco Screening: Have you used any form of tobacco in the last 30 days? (Cigarettes, Smokeless Tobacco, Cigars, and/or Pipes): Yes Tobacco use, Select all that apply: 5 or more cigarettes per day Are you interested in Tobacco Cessation Medications?: No, patient refused Counseled patient on smoking cessation including recognizing danger situations, developing coping skills and basic information about quitting provided: Refused/Declined practical counseling Social History:  Social History   Substance and Sexual Activity  Alcohol Use Yes     Social History   Substance and Sexual Activity  Drug Use Not on file    Additional Social History:                           Allergies:  No Known Allergies Lab Results:  Results for orders placed or performed during the hospital encounter of 04/17/19 (from the past 48 hour(s))  Acetaminophen level     Status: Abnormal   Collection Time: 04/17/19 11:59 PM  Result Value Ref Range   Acetaminophen (Tylenol), Serum <10 (L) 10 - 30 ug/mL    Comment: (NOTE) Therapeutic concentrations vary significantly. A range of 10-30 ug/mL  may be an  effective concentration for many patients. However, some  are best treated at concentrations outside of this range. Acetaminophen concentrations >150 ug/mL at 4 hours after ingestion  and >50 ug/mL at 12 hours after ingestion are often associated with  toxic reactions. Performed at [facility], [address] [place], [place] [address]   CBC with Differential     Status: Abnormal   Collection Time: 04/17/19 11:59 PM  Result Value Ref Range   WBC 9.7 4.0 - 10.5 K/uL   RBC 5.03 4.22 - 5.81 MIL/uL   Hemoglobin 15.4 13.0 - 17.0 g/dL   HCT 60.4 54.0 - 98.1 %   MCV 85.1 80.0 - 100.0 fL   MCH 30.6 26.0 - 34.0 pg   MCHC 36.0 30.0 - 36.0 g/dL   RDW 19.1 47.8 - 29.5 %   Platelets 194 150 - 400 K/uL   nRBC 0.0 0.0 - 0.2 %   Neutrophils Relative % 83 %   Neutro Abs 7.9 (H) 1.7 - 7.7 K/uL   Lymphocytes Relative 14 %   Lymphs Abs 1.4  0.7 - 4.0 K/uL   Monocytes Relative 3 %   Monocytes Absolute 0.3 0.1 - 1.0 K/uL   Eosinophils Relative 0 %   Eosinophils Absolute 0.0 0.0 - 0.5 K/uL   Basophils Relative 0 %   Basophils Absolute 0.0 0.0 - 0.1 K/uL   Immature Granulocytes 0 %   Abs Immature Granulocytes 0.03 0.00 - 0.07 K/uL    Comment: Performed at [facility], [address] [place], [place] [address]  Comprehensive metabolic panel     Status: Abnormal   Collection Time: 04/17/19 11:59 PM  Result Value Ref Range   Sodium 136 135 - 145 mmol/L   Potassium 3.4 (L) 3.5 - 5.1 mmol/L   Chloride 101 98 - 111 mmol/L   CO2 26 22 - 32 mmol/L   Glucose, Bld 162 (H) 70 - 99 mg/dL   BUN 11 6 - 20 mg/dL   Creatinine, Ser 6.04 0.61 - 1.24 mg/dL   Calcium 8.6 (L) 8.9 - 10.3 mg/dL   Total Protein 6.9 6.5 - 8.1 g/dL   Albumin 4.4 3.5 - 5.0 g/dL   AST 18 15 - 41 U/L   ALT 13 0 - 44 U/L   Alkaline Phosphatase 64 38 - 126 U/L   Total Bilirubin 0.9 0.3 - 1.2 mg/dL   GFR calc non Af Amer >60 >60 mL/min   GFR calc Af Amer >60 >60 mL/min   Anion gap 9 5 - 15    Comment: Performed  at [facility], [address], [place], [place] [address]  Salicylate level     Status: None   Collection Time: 04/17/19 11:59 PM  Result Value Ref Range   Salicylate Lvl <7.0 2.8 - 30.0 mg/dL    Comment: Performed at [facility], [address] [place], [place] [address]  Ethanol     Status: None   Collection Time: 04/17/19 11:59 PM  Result Value Ref Range   Alcohol, Ethyl (B) <10 <10 mg/dL    Comment: (NOTE) Lowest detectable limit for serum alcohol is 10 mg/dL. For medical purposes only. Performed at [facility], [address] [place], [place] [address]   Urinalysis, Complete w Microscopic     Status: Abnormal   Collection Time: 04/18/19 12:28 AM  Result Value Ref Range   Color, Urine YELLOW (A) YELLOW   APPearance CLEAR (A) CLEAR   Specific Gravity, Urine 1.009 1.005 - 1.030   pH 5.0 5.0 - 8.0   Glucose, UA NEGATIVE NEGATIVE mg/dL   Hgb urine dipstick NEGATIVE NEGATIVE   Bilirubin Urine NEGATIVE NEGATIVE   Ketones, ur 20 (A) NEGATIVE mg/dL   Protein, ur NEGATIVE NEGATIVE mg/dL   Nitrite NEGATIVE NEGATIVE   Leukocytes,Ua NEGATIVE NEGATIVE   WBC, UA 0-5 0 - 5 WBC/hpf   Bacteria, UA NONE SEEN NONE SEEN   Squamous Epithelial / LPF NONE SEEN 0 - 5   Mucus PRESENT     Comment: Performed at [facility], [address] [place], [place] [address]  Urine Drug Screen, Qualitative     Status: Abnormal   Collection Time: 04/18/19 12:28 AM  Result Value Ref Range   Tricyclic, Ur Screen NONE DETECTED NONE DETECTED   Amphetamines, Ur Screen POSITIVE (A) NONE DETECTED   MDMA (Ecstasy)Ur Screen NONE DETECTED NONE DETECTED   Cocaine Metabolite,Ur [place] NONE DETECTED NONE DETECTED   Opiate, Ur Screen NONE DETECTED NONE DETECTED   Phencyclidine (PCP) Ur S NONE DETECTED NONE DETECTED   Cannabinoid 50  Ng, Ur [place] NONE DETECTED NONE DETECTED   Barbiturates, Ur Screen NONE DETECTED NONE DETECTED   Benzodiazepine, Ur Scrn NONE DETECTED NONE DETECTED    Methadone Scn, Ur NONE DETECTED NONE DETECTED    Comment: (NOTE) Tricyclics + metabolites, urine    Cutoff 1000 ng/mL Amphetamines + metabolites, urine  Cutoff 1000 ng/mL MDMA (Ecstasy), urine              Cutoff 500 ng/mL Cocaine Metabolite, urine          Cutoff 300 ng/mL Opiate + metabolites, urine        Cutoff 300 ng/mL Phencyclidine (PCP), urine         Cutoff 25 ng/mL Cannabinoid, urine                 Cutoff 50 ng/mL Barbiturates + metabolites, urine  Cutoff 200 ng/mL Benzodiazepine, urine              Cutoff 200 ng/mL Methadone, urine                   Cutoff 300 ng/mL The urine drug screen provides only a preliminary, unconfirmed analytical test result and should not be used for non-medical purposes. Clinical consideration and professional judgment should be applied to any positive drug screen result due to possible interfering substances. A more specific alternate chemical method must be used in order to obtain a confirmed analytical result. Gas chromatography / mass spectrometry (GC/MS) is the preferred confirmat ory method. Performed at [facility], [address] [place], [place] [address]   Valproic acid level     Status: Abnormal   Collection Time: 04/18/19  3:55 AM  Result Value Ref Range   Valproic Acid Lvl <10 (L) 50.0 - 100.0 ug/mL    Comment: Performed at [facility], [address] [place], [place] [address]  SARS Coronavirus 2 (CEPHEID - Performed in [facility] hospital lab), Hosp Order     Status: None   Collection Time: 04/18/19  3:57 AM   Specimen: Nasopharyngeal Swab  Result Value Ref Range   SARS Coronavirus 2 NEGATIVE NEGATIVE    Comment: (NOTE) If result is NEGATIVE SARS-CoV-2 target nucleic acids are NOT DETECTED. The SARS-CoV-2 RNA is generally detectable in upper and lower  respiratory specimens during the acute phase of infection. The lowest  concentration of SARS-CoV-2 viral copies this assay can detect is 250   copies / mL. A negative result does not preclude SARS-CoV-2 infection  and should not be used as the sole basis for treatment or other  patient management decisions.  A negative result may occur with  improper specimen collection / handling, submission of specimen other  than nasopharyngeal swab, presence of viral mutation(s) within the  areas targeted by this assay, and inadequate number of viral copies  (<250 copies / mL). A negative result must be combined with clinical  observations, patient history, and epidemiological information. If result is POSITIVE SARS-CoV-2 target nucleic acids are DETECTED. The SARS-CoV-2 RNA is generally detectable in upper and lower  respiratory specimens dur ing the acute phase of infection.  Positive  results are indicative of active infection with SARS-CoV-2.  Clinical  correlation with patient history and other diagnostic information is  necessary to determine patient infection status.  Positive results do  not rule out bacterial infection or co-infection with other viruses. If result is PRESUMPTIVE POSTIVE SARS-CoV-2 nucleic acids MAY BE PRESENT.   A presumptive positive result was obtained on  the submitted specimen  and confirmed on repeat testing.  While 2019 novel coronavirus  (SARS-CoV-2) nucleic acids may be present in the submitted sample  additional confirmatory testing may be necessary for epidemiological  and / or clinical management purposes  to differentiate between  SARS-CoV-2 and other Sarbecovirus currently known to infect humans.  If clinically indicated additional testing with an alternate test  methodology [phone]) is advised. The SARS-CoV-2 RNA is generally  detectable in upper and lower respiratory sp ecimens during the acute  phase of infection. The expected result is Negative. Fact Sheet for Patients:  BoilerBrush.com.cy Fact Sheet for Healthcare  Providers: https://pope.com/ This test is not yet approved or cleared by the [place] FDA and has been authorized for detection and/or diagnosis of SARS-CoV-2 by FDA under an Emergency Use Authorization (EUA).  This EUA will remain in effect (meaning this test can be used) for the duration of the COVID-19 declaration under Section 564(b)(1) of the Act, 21 U.S.C. section 360bbb-3(b)(1), unless the authorization is terminated or revoked sooner. Performed at [facility], [address] [place], [place] [address]     Blood Alcohol level:  Lab Results  Component Value Date   [facility] <10 04/17/2019   ETH <10 04/17/2019    Metabolic Disorder Labs:  Lab Results  Component Value Date   HGBA1C 5.1 06/17/2018   MPG 100 06/17/2018   MPG 91.06 06/01/2017   No results found for: PROLACTIN Lab Results  Component Value Date   CHOL 157 06/17/2018   TRIG 170 (H) 06/17/2018   HDL 45 06/17/2018   CHOLHDL 3.5 06/17/2018   VLDL 34 06/17/2018   LDLCALC 78 06/17/2018   LDLCALC 93 06/01/2017    Current Medications: Current Facility-Administered Medications  Medication Dose Route Frequency Provider Last Rate Last Dose  . acetaminophen (TYLENOL) tablet 650 mg  650 mg Oral Q6H PRN Money, Gerlene Burdock, FNP      . alum & mag hydroxide-simeth (MAALOX/MYLANTA) 200-200-20 MG/5ML suspension 30 mL  30 mL Oral Q4H PRN Money, Gerlene Burdock, FNP      . docusate sodium (COLACE) capsule 100 mg  100 mg Oral BID [name], [name], MD      . hydrOXYzine (ATARAX/VISTARIL) tablet 25 mg  25 mg Oral TID PRN Money, Gerlene Burdock, FNP      . magnesium citrate solution 1 Bottle  1 Bottle Oral Daily PRN [name], [name], MD      . magnesium hydroxide (MILK OF MAGNESIA) suspension 30 mL  30 mL Oral Daily PRN Money, Feliz Beam B, FNP      . paliperidone (INVEGA) 24 hr tablet 6 mg  6 mg Oral QHS [name], [name], MD      . polyethylene glycol (MIRALAX / GLYCOLAX) packet 17 g  17 g Oral Daily [name], [name], MD   17 g at 04/19/19 1354  . traZODone (DESYREL) tablet 50 mg  50 mg Oral QHS PRN Money, Gerlene Burdock, FNP       PTA Medications: Medications Prior to Admission  Medication Sig Dispense Refill Last Dose  . dicyclomine (BENTYL) 20 MG tablet Take 1 tablet (20 mg total) by mouth every 6 (six) hours as needed. 20 tablet 0   . divalproex (DEPAKOTE) 250 MG DR tablet Take 3 tablets (750 mg total) by mouth every 12 (twelve) hours. (Patient taking differently: Take 500-750 mg by mouth every 12 (twelve) hours. Take 2 tablets by mouth in the morning, and 3 tablets at bedtime) 180 tablet  1   . lactulose (CHRONULAC) 10 GM/15ML solution Take 30 mLs (20 g total) by mouth daily as needed for mild constipation. 120 mL 0   . OLANZapine (ZYPREXA) 15 MG tablet Take 2 tablets (30 mg total) by mouth at bedtime. 60 tablet 1     Musculoskeletal: Strength & Muscle Tone: within normal limits Gait & Station: normal Patient leans: N/A  Psychiatric Specialty Exam: Physical Exam  Nursing note and vitals reviewed. Constitutional: He appears well-developed and well-nourished.  HENT:  Head: Normocephalic and atraumatic.  Eyes: Pupils are equal, round, and reactive to light. Conjunctivae are normal.  Neck: Normal range of motion.  Cardiovascular: Regular rhythm and normal heart sounds.  Respiratory: Effort normal.  GI: Soft.  Musculoskeletal: Normal range of motion.  Neurological: He is alert.  Skin: Skin is warm and dry.  Psychiatric: His affect is blunt. His speech is delayed. He is slowed and withdrawn. Cognition and memory are impaired. He expresses impulsivity. He expresses homicidal ideation. He expresses no suicidal ideation. He expresses no homicidal plans.    Review of Systems  Constitutional: Negative.   HENT: Negative.   Eyes: Negative.   Respiratory: Negative.   Cardiovascular: Negative.   Gastrointestinal: Positive for abdominal pain and constipation.  Musculoskeletal: Negative.   Skin: Negative.    Neurological: Negative.   Psychiatric/Behavioral: Positive for substance abuse. Negative for depression, hallucinations and suicidal ideas. The patient has insomnia. The patient is not nervous/anxious.     Blood pressure 112/80, pulse 67, temperature 97.9 F (36.6 C), temperature source Oral, resp. rate 16, height 5\' 9"  (1.753 m), weight 74.8 kg.Body mass index is 24.35 kg/m.  General Appearance: Fairly Groomed  Eye Contact:  Fair  Speech:  Slow  Volume:  Decreased  Mood:  Euthymic  Affect:  Constricted  Thought Process:  Disorganized  Orientation:  Full (Time, Place, and Person)  Thought Content:  Illogical, Rumination and Tangential  Suicidal Thoughts:  No  Homicidal Thoughts:  Yes.  without intent/plan  Memory:  Immediate;   Fair Recent;   Poor Remote;   Poor  Judgement:  Impaired  Insight:  Shallow  Psychomotor Activity:  Decreased  Concentration:  Concentration: Poor  Recall:  Poor  Fund of Knowledge:  Poor  Language:  Poor  Akathisia:  No  Handed:  Right  AIMS (if indicated):     Assets:  Resilience  ADL's:  Impaired  Cognition:  Impaired,  Mild  Sleep:  Number of Hours: 7.75    Treatment Plan Summary: Daily contact with patient to assess and evaluate symptoms and progress in treatment, Medication management and Plan Patient with bipolar or schizoaffective disorder and substance abuse problems presented angry and irritable to the emergency room.  He is minimizing to the point of complete denial any current symptoms.  Has no insight or wish to have his mental health treatment applied.  Admits to having homicidal ideation but has no justification for it.  Patient has presented with bizarre and somewhat unnerving affect like this in the past.  Starting in [place] in the hopes that we can get him at least a long-acting antipsychotic prior to discharge.  Try to engage in individual and group therapy.  Observation Level/Precautions:  15 minute checks  Laboratory:  Chemistry  Profile  Psychotherapy:    Medications:    Consultations:    Discharge Concerns:    Estimated LOS:  Other:     Physician Treatment Plan for Primary Diagnosis: Bipolar I disorder, most recent episode (or  current) manic ([place]) Long Term Goal(s): Improvement in symptoms so as ready for discharge  Short Term Goals: Ability to verbalize feelings will improve, Ability to disclose and discuss suicidal ideas and Ability to demonstrate self-control will improve  Physician Treatment Plan for Secondary Diagnosis: Principal Problem:   Bipolar I disorder, most recent episode (or current) manic ([place]) Active Problems:   Amphetamine abuse ([place])   Noncompliance   Constipation  Long Term Goal(s): Improvement in symptoms so as ready for discharge  Short Term Goals: Compliance with prescribed medications will improve  I certify that inpatient services furnished can reasonably be expected to improve the patient's condition.    Alethia Berthold, MD 8/1/20202:59 PM

## 2019-04-19 NOTE — Progress Notes (Signed)
D: Patient stated slept good last night .Stated appetite good and energy level   normal. Stated concentration good . Stated on Depression scale 0 , hopeless 0 and anxiety  0.( low 0-10 high) Denies suicidal,homicidal ideations .Denies  auditory hallucinations  No pain concerns . Appropriate ADL'S. Limited  Interacting with peers and staff.  Patient aware of information received , able to verbalize understanding .  Emotional and mental status improved . Encourage patient  to work on stress with anxiety  coping  skills and decision making  in therapy group.  Compliant with medication . Patient denies  suicidal ideation . Patient remains isolated in room . Patient did not attend group therapy this shift.  A: Encourage patient participation with unit programming . Instruction  Given on  Medication , verbalize understanding.  R: Voice no other concerns. Staff continue to monitor

## 2019-04-19 NOTE — [facility] Suicide Risk Assessment (Signed)
[facility]   Nursing information obtained from:  Patient Demographic factors:  Male Current Mental Status:  NA Loss Factors:  Loss of significant relationship Historical Factors:  NA Risk Reduction Factors:  Employed  Total Time spent with patient: 1 hour Principal Problem: Bipolar I disorder, most recent episode (or current) manic (HCC) Diagnosis:  Principal Problem:   Bipolar I disorder, most recent episode (or current) manic (HCC) Active Problems:   Amphetamine abuse (HCC)   Noncompliance   Constipation  Subjective Data: Patient seen chart reviewed.  Patient known from previous encounters.  Patient with a history of psychotic disorder presented to the emergency room complaining of medical issues but was so disorganized and agitated he was thought to require psychiatric hospitalization.  Patient denies suicidal thoughts.  Admits to having made statements about killing people on the telephone to his mother.  Does not appear to have any insight as to why that would be a problem.  Says he frequently has thoughts about killing people but denies any specific intention to do so to anyone in particular.  Continued Clinical Symptoms:  Alcohol Use Disorder Identification Test Final Score (AUDIT): 0 The "Alcohol Use Disorders Identification Test", Guidelines for Use in Primary Care, Second Edition.  World Science writerHealth Organization [facility](WHO). Score between 0-7:  no or low risk or alcohol related problems. Score between 8-15:  moderate risk of alcohol related problems. Score between 16-19:  high risk of alcohol related problems. Score 20 or above:  warrants further diagnostic evaluation for alcohol dependence and treatment.   CLINICAL FACTORS:   Bipolar Disorder:   Mixed State Alcohol/Substance Abuse/Dependencies Schizophrenia:   Paranoid or undifferentiated type   Musculoskeletal: Strength & Muscle Tone: within normal limits Gait & Station: normal Patient leans:  N/A  Psychiatric Specialty Exam: Physical Exam  Nursing note and vitals reviewed. Constitutional: He appears well-developed and well-nourished.  HENT:  Head: Normocephalic and atraumatic.  Eyes: Pupils are equal, round, and reactive to light. Conjunctivae are normal.  Neck: Normal range of motion.  Cardiovascular: Regular rhythm and normal heart sounds.  Respiratory: Effort normal.  GI: Soft.  Musculoskeletal: Normal range of motion.  Neurological: He is alert.  Skin: Skin is warm and dry.  Psychiatric: His affect is blunt. His speech is delayed. He is slowed. Thought content is paranoid. Cognition and memory are impaired. He expresses inappropriate judgment. He expresses homicidal ideation. He expresses no homicidal plans.    Review of Systems  Constitutional: Negative.   HENT: Negative.   Eyes: Negative.   Respiratory: Negative.   Cardiovascular: Negative.   Gastrointestinal: Positive for abdominal pain and constipation.  Musculoskeletal: Negative.   Skin: Negative.   Neurological: Negative.   Psychiatric/Behavioral: Positive for memory loss and substance abuse. Negative for depression, hallucinations and suicidal ideas. The patient has insomnia. The patient is not nervous/anxious.     Blood pressure 112/80, pulse 67, temperature 97.9 F (36.6 C), temperature source Oral, resp. rate 16, height 5\' 9"  (1.753 m), weight 74.8 kg.Body mass index is 24.35 kg/m.  General Appearance: Casual  Eye Contact:  Fair  Speech:  Slow  Volume:  Decreased  Mood:  Euthymic  Affect:  Constricted  Thought Process:  Disorganized  Orientation:  Full (Time, Place, and Person)  Thought Content:  Paranoid Ideation and Rumination  Suicidal Thoughts:  No  Homicidal Thoughts:  Yes.  without intent/plan  Memory:  Immediate;   Fair Recent;   Poor Remote;   Poor  Judgement:  Impaired  Insight:  Shallow  Psychomotor Activity:  Decreased  Concentration:  Concentration: Poor  Recall:  Poor  Fund of  Knowledge:  Poor  Language:  Fair  Akathisia:  No  Handed:  Right  AIMS (if indicated):     Assets:  Resilience  ADL's:  Intact  Cognition:  Impaired,  Mild  Sleep:  Number of Hours: 7.75      COGNITIVE FEATURES THAT CONTRIBUTE TO RISK:  Polarized thinking and Thought constriction (tunnel vision)    SUICIDE RISK:   Mild:  Suicidal ideation of limited frequency, intensity, duration, and specificity.  There are no identifiable plans, no associated intent, mild dysphoria and related symptoms, good self-control (both objective and subjective assessment), few other risk factors, and identifiable protective factors, including available and accessible social support.  PLAN OF CARE: Continue current 15-minute checks.  Treat his constipation as requested.  Continue current antipsychotic medicine with in [place] in the hopes that we can give him a long-acting injectable shot prior to discharge.  Reassess dangerousness prior to discharge  I certify that inpatient services furnished can reasonably be expected to improve the patient's condition.   Alethia Berthold, MD 04/19/2019, 2:55 PM

## 2019-04-19 NOTE — Progress Notes (Signed)
Patient alert and oriented , affect is blunted, she forwards very little, assertive and guarded, he denies SI/HI/AVH not interacting with peers and staff appropriately. Patient's thoughts are organized and coherent , he does not make appropriate eye contact, patient was complaint with medication regimen, 15 minutes safety checks maintained will continue to monitor.

## 2019-04-20 MED ORDER — LACTULOSE 10 GM/15ML PO SOLN
30.0000 g | Freq: Two times a day (BID) | ORAL | Status: DC
  Administered 2019-04-20: 30 g via ORAL
  Filled 2019-04-20 (×2): qty 60

## 2019-04-20 MED ORDER — DOCUSATE SODIUM 100 MG PO CAPS
200.0000 mg | ORAL_CAPSULE | Freq: Two times a day (BID) | ORAL | Status: DC
  Administered 2019-04-20: 200 mg via ORAL
  Filled 2019-04-20 (×2): qty 2

## 2019-04-20 NOTE — BHH Group Notes (Signed)
LCSW Group Therapy Note 04/20/2019 1:15pm  Type of Therapy and Topic: Group Therapy: Feelings Around Returning Home & Establishing a Supportive Framework and Supporting Oneself When Supports Not Available  Participation Level: Did Not Attend  Description of Group:  Patients first processed thoughts and feelings about upcoming discharge. These included fears of upcoming changes, lack of change, new living environments, judgements and expectations from others and overall stigma of mental health issues. The group then discussed the definition of a supportive framework, what that looks and feels like, and how do to discern it from an unhealthy non-supportive network. The group identified different types of supports as well as what to do when your family/friends are less than helpful or unavailable  Therapeutic Goals  1. Patient will identify one healthy supportive network that they can use at discharge. 2. Patient will identify one factor of a supportive framework and how to tell it from an unhealthy network. 3. Patient able to identify one coping skill to use when they do not have positive supports from others. 4. Patient will demonstrate ability to communicate their needs through discussion and/or role plays.  Summary of Patient Progress:  Pt was invited to attend group but chose not to attend. CSW will continue to encourage pt to attend group throughout their admission.  Therapeutic Modalities Cognitive Behavioral Therapy Motivational Interviewing   Draycen Leichter  CUEBAS-COLON, LCSW 04/20/2019 12:07 PM

## 2019-04-20 NOTE — Progress Notes (Signed)
[facility]  04/20/2019 2:15 PM Brandon Dodson  MRN:  [id number] Subjective: Patient seen chart reviewed.  Patient is in bed today very withdrawn still.  He denies having a suicidal or homicidal thought.  Denies hallucinations.  Says he is still constipated and has not had a bowel movement.  Patient requests that I give him Suboxone.  Claims that he was taking it outside the hospital.  Review of the controlled substance database shows he has not had any of that medicine for approximately 1 year.  Patient had not reported any opiate use on admission and does not appear to be having specific symptoms of opiate withdrawal at this point. Principal Problem: Bipolar I disorder, most recent episode (or current) manic (HCC) Diagnosis: Principal Problem:   Bipolar I disorder, most recent episode (or current) manic (HCC) Active Problems:   Amphetamine abuse (HCC)   Noncompliance   Constipation  Total Time spent with patient: 30 minutes  Past Psychiatric History: Patient has a history of recurrent presentation to the hospital for bipolar disorder and heavy substance abuse  Past Medical History: History reviewed. No pertinent past medical history.  Past Surgical History:  Procedure Laterality Date  . DENTAL SURGERY     Family History: History reviewed. No pertinent family history. Family Psychiatric  History: See previous Social History:  Social History   Substance and Sexual Activity  Alcohol Use Yes     Social History   Substance and Sexual Activity  Drug Use Not on file    Social History   Socioeconomic History  . Marital status: Single    Spouse name: Not on file  . Number of children: Not on file  . Years of education: Not on file  . Highest education level: Not on file  Occupational History  . Not on file  Social Needs  . Financial resource strain: Not on file  . Food insecurity    Worry: Not on file    Inability: Not on file  . Transportation needs   Medical: Not on file    Non-medical: Not on file  Tobacco Use  . Smoking status: Current Every Day Smoker    Packs/day: 1.00    Types: Cigarettes  . Smokeless tobacco: Never Used  Substance and Sexual Activity  . Alcohol use: Yes  . Drug use: Not on file  . Sexual activity: Not on file  Lifestyle  . Physical activity    Days per week: Not on file    Minutes per session: Not on file  . Stress: Not on file  Relationships  . Social Musicianconnections    Talks on phone: Not on file    Gets together: Not on file    Attends religious service: Not on file    Active member of club or organization: Not on file    Attends meetings of clubs or organizations: Not on file    Relationship status: Not on file  Other Topics Concern  . Not on file  Social History Narrative  . Not on file   Additional Social History:                         Sleep: Fair  Appetite:  Poor  Current Medications: Current Facility-Administered Medications  Medication Dose Route Frequency Provider Last Rate Last Dose  . acetaminophen (TYLENOL) tablet 650 mg  650 mg Oral Q6H PRN Money, Gerlene Burdockravis B, FNP      . alum & mag  hydroxide-simeth (MAALOX/MYLANTA) 200-200-20 MG/5ML suspension 30 mL  30 mL Oral Q4H PRN Money, Lowry Ram, FNP      . docusate sodium (COLACE) capsule 200 mg  200 mg Oral BID ,  T, MD      . hydrOXYzine (ATARAX/VISTARIL) tablet 25 mg  25 mg Oral TID PRN Money, Lowry Ram, FNP   25 mg at 04/20/19 0254  . lactulose (CHRONULAC) 10 GM/15ML solution 30 g  30 g Oral BID ,  T, MD      . magnesium citrate solution 1 Bottle  1 Bottle Oral Daily PRN ,  T, MD      . magnesium hydroxide (MILK OF MAGNESIA) suspension 30 mL  30 mL Oral Daily PRN Money, Darnelle Maffucci B, FNP      . paliperidone (INVEGA) 24 hr tablet 6 mg  6 mg Oral QHS , Madie Reno, MD   6 mg at 04/19/19 2108  . polyethylene glycol (MIRALAX / GLYCOLAX) packet 17 g  17 g Oral Daily , Madie Reno, MD   17 g at 04/20/19  2706  . traZODone (DESYREL) tablet 50 mg  50 mg Oral QHS PRN Money, Lowry Ram, FNP   50 mg at 04/19/19 2108    Lab Results: No results found for this or any previous visit (from the past 48 hour(s)).  Blood Alcohol level:  Lab Results  Component Value Date   ETH <10 04/17/2019   ETH <10 23/76/2831    Metabolic Disorder Labs: Lab Results  Component Value Date   HGBA1C 5.1 06/17/2018   MPG 100 06/17/2018   MPG 91.06 06/01/2017   No results found for: PROLACTIN Lab Results  Component Value Date   CHOL 157 06/17/2018   TRIG 170 (H) 06/17/2018   HDL 45 06/17/2018   CHOLHDL 3.5 06/17/2018   VLDL 34 06/17/2018   LDLCALC 78 06/17/2018   LDLCALC 93 06/01/2017    Physical Findings: AIMS:  , ,  ,  ,    CIWA:    COWS:     Musculoskeletal: Strength & Muscle Tone: within normal limits Gait & Station: normal Patient leans: N/A  Psychiatric Specialty Exam: Physical Exam  Nursing note and vitals reviewed. Constitutional: He appears well-developed and well-nourished.  HENT:  Head: Normocephalic and atraumatic.  Eyes: Pupils are equal, round, and reactive to light. Conjunctivae are normal.  Neck: Normal range of motion.  Cardiovascular: Regular rhythm and normal heart sounds.  Respiratory: Effort normal.  GI: Soft.  Musculoskeletal: Normal range of motion.  Neurological: He is alert.  Skin: Skin is warm and dry.  Psychiatric: His affect is blunt. His speech is delayed. He is slowed and withdrawn. Thought content is paranoid. Cognition and memory are impaired. He expresses impulsivity. He expresses no suicidal ideation.    Review of Systems  Constitutional: Negative.   HENT: Negative.   Eyes: Negative.   Respiratory: Negative.   Cardiovascular: Negative.   Gastrointestinal: Positive for constipation.  Musculoskeletal: Negative.   Skin: Negative.   Neurological: Negative.   Psychiatric/Behavioral: Negative.  Negative for depression.    Blood pressure 102/70, pulse 78,  temperature 98.4 F (36.9 C), temperature source Oral, resp. rate 18, height 5\' 9"  (1.753 m), weight 74.8 kg, SpO2 98 %.Body mass index is 24.35 kg/m.  General Appearance: Casual  Eye Contact:  Minimal  Speech:  Slow  Volume:  Decreased  Mood:  Dysphoric  Affect:  Congruent  Thought Process:  Disorganized  Orientation:  Full (Time, Place, and Person)  Thought  Content:  Illogical  Suicidal Thoughts:  No  Homicidal Thoughts:  No  Memory:  Immediate;   Fair Recent;   Fair Remote;   Fair  Judgement:  Impaired  Insight:  Shallow  Psychomotor Activity:  Decreased  Concentration:  Concentration: Fair  Recall:  [employer]is[employer]Fair  Fund of Knowledge:  Fair  Language:  Fair  Akathisia:  No  Handed:  Right  AIMS (if indicated):     Assets:  Physical Health  ADL's:  Impaired  Cognition:  Impaired,  Mild  Sleep:  Number of Hours: 7.75     Treatment Plan Summary: Daily contact with patient to assess and evaluate symptoms and progress in treatment, Medication management and Plan No clear indication for use of Suboxone.  No prescription written.  I did add standing lactulose for his complaint about still being severely constipated.  Continue current antipsychotic.  Encourage patient to be up out of his room and interact a little bit more.  Reassess tomorrow.  Mordecai RasmussenJohn , MD 04/20/2019, 2:15 PM

## 2019-04-20 NOTE — Progress Notes (Signed)
D: Patient has been isolative to room and self. Contracting for safety. Denies SI, HI and AVH. Mood is depressed. Affect is flat.  A: Continue to monitor for safety R: Safety maintained.

## 2019-04-20 NOTE — BHH Counselor (Signed)
Adult Comprehensive Assessment  Patient ID: Brandon BottomsDaniel Scott Dodson, male   DOB: 1981/08/16, 38 y.o.   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 Family Relationships: Pt shared that he is close to all his family Surveyor, quantityinancial / Lack of resources (include bankruptcy): No issues noted Housing / Lack of housing: Pt shared that he has stable housing Physical health (include injuries & life threatening diseases): No issues noted Social relationships: None identifed Substance abuse: Pt denies Bereavement / Loss: None noted  Living/Environment/Situation:  Living Arrangements: Alone Living conditions (as described by patient or guardian): Pt did not give a response Who else lives in the home?: Pt lives with his girlfriend How long has patient lived in current situation?: Pt declined to answer What is atmosphere in current home: Comfortable  Family History:  Marital status: Divorced Divorced, when?: Pt declined to answer What types of issues is patient dealing with in the relationship?: None noted Additional relationship information: None noted Are you sexually active?: (Pt declined to answer) What is your sexual orientation?: Heterosexual Has your sexual activity been affected by drugs, alcohol, medication, or emotional stress?: Pt declined to answer Does patient have children?: (Pt declined to answer)  Childhood History:  By whom was/is the patient raised?: Both parents Additional childhood history information: Pt declined to answer Description of patient's relationship with caregiver when they were a child: Pt declined to answer Patient's  description of current relationship with people who raised him/her: Pt declined to answer How were you disciplined when you got in trouble as a child/adolescent?: Pt declined to answer Does patient have siblings?: (Pt declined to answer) Did patient suffer any verbal/emotional/physical/sexual abuse as a child?: (Pt declined to answer) Did patient suffer from severe childhood neglect?: (Pt declined to answer) Has patient ever been sexually abused/assaulted/raped as an adolescent or adult?: (Pt declined to answer) Was the patient ever a victim of a crime or a disaster?: (Pt declined to answer) Witnessed domestic violence?: (Pt declined to answer) Has patient been effected by domestic violence as an adult?: (Pt declined to answer)  Education:  Highest grade of school patient has completed: 12th grade/Diploma Currently a student?: No Learning disability?: No  Employment/Work Situation:   Employment situation: Employed Where is patient currently employed?: 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?: No Are There Guns or Other Weapons in Your Home?: (Pt declined to answer) Are These Weapons Safely Secured?: (Unknown)  Financial Resources:   Financial resources: Income from employment Does patient have a representative payee or guardian?: No  Alcohol/Substance Abuse:   What has been your use of drugs/alcohol within the last 12 months?: Pt declined to answer If attempted suicide, did drugs/alcohol play a role in this?: (Unknown) Alcohol/Substance Abuse Treatment Hx: (Pt declined to answer) Has alcohol/substance abuse ever caused legal problems?: (Pt declined to  answer)  Social Support System:   Patient's  Community Support System: Manufacturing engineer System: Pt shared his family Type of faith/religion: Unknown How does patient's faith help to cope with current illness?: Unknown  Leisure/Recreation:   Leisure and Hobbies: Pt declined to answer  Strengths/Needs:   What is the patient's perception of their strengths?: Pt declined to answer Patient states they can use these personal strengths during their treatment to contribute to their recovery: Pt declined to answer Patient states these barriers may affect/interfere with their treatment: Pt declined to answer Patient states these barriers may affect their return to the community: Pt declined to answer Other important information patient would like considered in planning for their treatment: None noted  Discharge Plan:   Currently receiving community mental health services: yes, [facility] - next appt is on 04/30/19 Patient states concerns and preferences for aftercare planning are: Pt declined to answer Patient states they will know when they are safe and ready for discharge when: Pt declined to answer Does patient have access to transportation?: Yes Does patient have financial barriers related to discharge medications?: (Pt declined to answer) Patient description of barriers related to discharge medications: None noted Will patient be returning to same living situation after discharge?: Yes       Summary/Recommendations:   Summary and Recommendations (to be completed by the evaluator): Patient is a 38 year old male admitted involuntarily and diagnosed with Bipolar I disorder, most recent episode (or current) manic.  Patient with a history of mental health problems who presented to the emergency room initially complaining of abdominal pain but was noted to be agitated and acting strangely from the very beginning.  Emergency room staff assessed the patient and found that he became more  agitated during his time in the emergency room.  Made phone calls to his mother during which it was noted that he made comments about killing people.  On interview today the patient admits that he talked about killing people with his mother. Patient will benefit from crisis stabilization, medication evaluation, group therapy and psychoeducation. In addition to case management for discharge planning. At discharge it is recommended that patient adhere to the established discharge plan and continue treatment.  Ramsey Guadamuz  CUEBAS-COLON. 04/20/2019

## 2019-04-20 NOTE — Plan of Care (Signed)
Patient oriented to unit. Patient's safety is maintained on unit. Patient denies SI/HI/AVH. Patient is mainly isolative to room.   Problem: Education: Goal: Knowledge of [facility] Education information/materials will improve Outcome: Progressing   Problem: Education: Goal: Ability to make informed decisions regarding treatment will improve Outcome: Progressing   Problem: Education: Goal: Verbalization of understanding the information provided will improve Outcome: Progressing

## 2019-04-20 NOTE — Plan of Care (Signed)
  Problem: Education: Goal: Knowledge of [facility] Education information/materials will improve Outcome: Progressing Goal: Emotional status will improve Outcome: Progressing Goal: Mental status will improve Outcome: Progressing Goal: Verbalization of understanding the information provided will improve Outcome: Progressing  D: Patient has been isolative to room and self. Contracting for safety. Denies SI, HI and AVH. Mood is depressed. Affect is flat.  A: Continue to monitor for safety R: Safety maintained.

## 2019-04-20 NOTE — BHH Suicide Risk Assessment (Signed)
[place] INPATIENT:  Family/Significant Other Suicide Prevention Education  Suicide Prevention Education:  Patient Refusal for Family/Significant Other Suicide Prevention Education: The patient Brandon Dodson has refused to provide written consent for family/significant other to be provided Family/Significant Other Suicide Prevention Education during admission and/or prior to discharge.  Physician notified.  Daniell Mancinas  CUEBAS-COLON 04/20/2019, 3:34 PM

## 2019-04-21 MED ORDER — TRAZODONE HCL 50 MG PO TABS: 50.0000 mg | ORAL_TABLET | Freq: Every evening | ORAL | 0 refills | Status: DC | PRN

## 2019-04-21 MED ORDER — PALIPERIDONE ER 6 MG PO TB24: 6.0000 mg | ORAL_TABLET | Freq: Every day | ORAL | 0 refills | Status: DC

## 2019-04-21 MED ORDER — DOCUSATE SODIUM 100 MG PO CAPS: 200.0000 mg | ORAL_CAPSULE | Freq: Two times a day (BID) | ORAL | 0 refills | Status: DC

## 2019-04-21 NOTE — Progress Notes (Signed)
Patient spent most of shift resting in bed, he denies any suicidal or homicidal thoughts, awaken for medications and returned back to bed. Patient is resting in bed eyes closed at this time. No issues to report on shift at this time.

## 2019-04-21 NOTE — [facility] Group Notes (Signed)
LCSW Group Therapy Note   04/21/2019 11:51 AM   Type of Therapy and Topic:  Group Therapy:  Overcoming Obstacles   Participation Level:  Did Not Attend   Description of Group:    In this group patients will be encouraged to explore what they see as obstacles to their own wellness and recovery. They will be guided to discuss their thoughts, feelings, and behaviors related to these obstacles. The group will process together ways to cope with barriers, with attention given to specific choices patients can make. Each patient will be challenged to identify changes they are motivated to make in order to overcome their obstacles. This group will be process-oriented, with patients participating in exploration of their own experiences as well as giving and receiving support and challenge from other group members.   Therapeutic Goals: 1. Patient will identify personal and current obstacles as they relate to admission. 2. Patient will identify barriers that currently interfere with their wellness or overcoming obstacles.  3. Patient will identify feelings, thought process and behaviors related to these barriers. 4. Patient will identify two changes they are willing to make to overcome these obstacles:      Summary of Patient Progress x     Therapeutic Modalities:   Cognitive Behavioral Therapy Solution Focused Therapy Motivational Interviewing Relapse Prevention Therapy  Katiria Calame, MSW, LCSW Clinical Social Work 04/21/2019 11:51 AM   

## 2019-04-21 NOTE — Plan of Care (Signed)
Pt. Not endorses any suicidal or homicidal ideations to this Probation officer. Pt. Denies AVH. Pt. Endorses a mostly normal mood. Pt. Refused his medications today, not compliant.    Problem: Medication: Goal: Compliance with prescribed medication regimen will improve Outcome: Not Progressing   Problem: Self-Concept: Goal: Ability to disclose and discuss suicidal ideas will improve Outcome: Progressing   Problem: Education: Goal: Emotional status will improve Outcome: Progressing Goal: Mental status will improve Outcome: Progressing

## 2019-04-21 NOTE — Progress Notes (Signed)
[facility] MD Progress Note  04/21/2019 4:58 PM Brandon Dodson  MRN:  [id number] Subjective: Follow-up for this patient with history of mood instability behavior problem substance abuse.  Patient was awake and alert and cooperative this morning.  Denies suicidal or homicidal thought.  Denies depression.  Indicates that substance abuse has been the largest part of his recent problem.  We discussed the specifics that led to his commitment and he completely denies any thought about hurting anyone.  He implies that he is probably involved in ongoing illegal activity but does not feel particularly concerned about it.  His behavior on the unit has been calm not aggressive or agitated or threatening.  Constipation problem solved principal Problem: Bipolar I disorder, most recent episode (or current) manic ([place]) Diagnosis: Principal Problem:   Bipolar I disorder, most recent episode (or current) manic ([place]) Active Problems:   Amphetamine abuse ([place])   Noncompliance   Constipation  Total Time spent with patient: 30 minutes  Past Psychiatric History: Patient has a history of previous evaluation for substance abuse and some psychotic symptoms possible bipolar disorder  Past Medical History: History reviewed. No pertinent past medical history.  Past Surgical History:  Procedure Laterality Date  . DENTAL SURGERY     Family History: History reviewed. No pertinent family history. Family Psychiatric  History: See previous Social History:  Social History   Substance and Sexual Activity  Alcohol Use Yes     Social History   Substance and Sexual Activity  Drug Use Not on file    Social History   Socioeconomic History  . Marital status: Single    Spouse name: Not on file  . Number of children: Not on file  . Years of education: Not on file  . Highest education level: Not on file  Occupational History  . Not on file  Social Needs  . Financial resource strain: Not on file  . Food insecurity   Worry: Not on file    Inability: Not on file  . Transportation needs    Medical: Not on file    Non-medical: Not on file  Tobacco Use  . Smoking status: Current Every Day Smoker    Packs/day: 1.00    Types: Cigarettes  . Smokeless tobacco: Never Used  Substance and Sexual Activity  . Alcohol use: Yes  . Drug use: Not on file  . Sexual activity: Not on file  Lifestyle  . Physical activity    Days per week: Not on file    Minutes per session: Not on file  . Stress: Not on file  Relationships  . Social Herbalist on phone: Not on file    Gets together: Not on file    Attends religious service: Not on file    Active member of club or organization: Not on file    Attends meetings of clubs or organizations: Not on file    Relationship status: Not on file  Other Topics Concern  . Not on file  Social History Narrative  . Not on file   Additional Social History:                         Sleep: Fair  Appetite:  Fair  Current Medications: Current Facility-Administered Medications  Medication Dose Route Frequency Provider Last Rate Last Dose  . acetaminophen (TYLENOL) tablet 650 mg  650 mg Oral Q6H PRN Money, Lowry Ram, FNP      .  alum & mag hydroxide-simeth (MAALOX/MYLANTA) 200-200-20 MG/5ML suspension 30 mL  30 mL Oral Q4H PRN Money, Gerlene Burdockravis B, FNP      . docusate sodium (COLACE) capsule 200 mg  200 mg Oral BID Aydin Hink T, MD   200 mg at 04/20/19 1634  . hydrOXYzine (ATARAX/VISTARIL) tablet 25 mg  25 mg Oral TID PRN Money, Gerlene Burdockravis B, FNP   25 mg at 04/20/19 [id number]  . lactulose (CHRONULAC) 10 GM/15ML solution 30 g  30 g Oral BID Elizeo Rodriques, Jackquline DenmarkJohn T, MD   30 g at 04/20/19 1634  . magnesium citrate solution 1 Bottle  1 Bottle Oral Daily PRN Reygan Heagle T, MD      . magnesium hydroxide (MILK OF MAGNESIA) suspension 30 mL  30 mL Oral Daily PRN Money, Feliz Beamravis B, FNP      . paliperidone (INVEGA) 24 hr tablet 6 mg  6 mg Oral QHS Tessica Cupo, Jackquline DenmarkJohn T, MD   6 mg at 04/20/19  2110  . polyethylene glycol (MIRALAX / GLYCOLAX) packet 17 g  17 g Oral Daily Mylani Gentry, Jackquline DenmarkJohn T, MD   17 g at 04/20/19 [id number]  . traZODone (DESYREL) tablet 50 mg  50 mg Oral QHS PRN Money, Gerlene Burdockravis B, FNP   50 mg at 04/20/19 2110    Lab Results: No results found for this or any previous visit (from the past 48 hour(s)).  Blood Alcohol level:  Lab Results  Component Value Date   ETH <10 04/17/2019   ETH <10 04/17/2019    Metabolic Disorder Labs: Lab Results  Component Value Date   HGBA1C 5.1 06/17/2018   MPG 100 06/17/2018   MPG 91.06 06/01/2017   No results found for: PROLACTIN Lab Results  Component Value Date   CHOL 157 06/17/2018   TRIG 170 (H) 06/17/2018   HDL 45 06/17/2018   CHOLHDL 3.5 06/17/2018   VLDL 34 06/17/2018   LDLCALC 78 06/17/2018   LDLCALC 93 06/01/2017    Physical Findings: AIMS:  , ,  ,  ,    CIWA:    COWS:     Musculoskeletal: Strength & Muscle Tone: within normal limits Gait & Station: normal Patient leans: N/A  Psychiatric Specialty Exam: Physical Exam  Nursing note and vitals reviewed. Constitutional: He appears well-developed and well-nourished.  HENT:  Head: Normocephalic and atraumatic.  Eyes: Pupils are equal, round, and reactive to light. Conjunctivae are normal.  Neck: Normal range of motion.  Cardiovascular: Regular rhythm and normal heart sounds.  Respiratory: Effort normal.  GI: Soft.  Musculoskeletal: Normal range of motion.  Neurological: He is alert.  Skin: Skin is warm and dry.  Psychiatric: He has a normal mood and affect. His behavior is normal. Judgment and thought content normal.    Review of Systems  Constitutional: Negative.   HENT: Negative.   Eyes: Negative.   Respiratory: Negative.   Cardiovascular: Negative.   Gastrointestinal: Negative.   Musculoskeletal: Negative.   Skin: Negative.   Neurological: Negative.   Psychiatric/Behavioral: Negative.     Blood pressure 101/65, pulse 64, temperature 98.4 F (36.9  C), temperature source Oral, resp. rate 17, height 5\' 9"  (1.753 m), weight 74.8 kg, SpO2 98 %.Body mass index is 24.35 kg/m.  General Appearance: Casual  Eye Contact:  Good  Speech:  Clear and Coherent  Volume:  Normal  Mood:  Euthymic  Affect:  Congruent  Thought Process:  Goal Directed  Orientation:  Full (Time, Place, and Person)  Thought Content:  Logical  Suicidal Thoughts:  No  Homicidal Thoughts:  No  Memory:  Immediate;   Fair Recent;   Fair Remote;   Fair  Judgement:  Fair  Insight:  Fair  Psychomotor Activity:  Decreased  Concentration:  Concentration: Fair  Recall:  FiservFair  Fund of Knowledge:  Fair  Language:  Fair  Akathisia:  No  Handed:  Right  AIMS (if indicated):     Assets:  Desire for Improvement  ADL's:  Intact  Cognition:  WNL  Sleep:  Number of Hours: 8.5     Treatment Plan Summary: Daily contact with patient to assess and evaluate symptoms and progress in treatment, Medication management and Plan Patient appears to have stabilized back to his baseline and probably will be ready for discharge tomorrow.  Does not appear to require any outpatient medication.  Mordecai RasmussenJohn Starlin Steib, MD 04/21/2019, 4:58 PM

## 2019-04-21 NOTE — Progress Notes (Signed)
Recreation Therapy Notes  Date: 04/21/2019  Time: 9:30 am   Location: Craft room   Behavioral response: N/A   Intervention Topic: Necessities   Discussion/Intervention: Patient did not attend group.   Clinical Observations/Feedback:  Patient did not attend group.   Olon Russ LRT/CTRS         Garry Nicolini 04/21/2019 10:43 AM 

## 2019-04-21 NOTE — Progress Notes (Signed)
D: Patient has been isolative to room and self. Denies SI, HI and AVH. Denies symptoms of withdrawal. A: Continue to monitor for safety R: Safety maintained.

## 2019-04-21 NOTE — Progress Notes (Signed)
D: Pt during assessments denies si/hi/avh, contracts for safety. Pt. Denies pain. Pt. Endorses a mostly normal mood.   A: Q x 15 minute observation checks were completed for safety. Patient was provided with education.  Patient was given/offered medications per orders. Patient  was encourage to attend groups, participate in unit activities and continue with plan of care. Pt. Chart and plans of care reviewed. Pt. Given support and encouragement.   R: Patient is non-complaint with medication and unit procedures. Pt. Is very isolative and withdrawn to room, resting.

## 2019-04-21 NOTE — Tx Team (Signed)
Interdisciplinary Treatment and Diagnostic Plan Update  04/21/2019 Time of Session: 77PM Brandon Dodson MRN: [id number]  Principal Diagnosis: Bipolar I disorder, most recent episode (or current) manic ([place])  Secondary Diagnoses: Principal Problem:   Bipolar I disorder, most recent episode (or current) manic ([place]) Active Problems:   Amphetamine abuse ([place])   Noncompliance   Constipation   Current Medications:  Current Facility-Administered Medications  Medication Dose Route Frequency Provider Last Rate Last Dose  . acetaminophen (TYLENOL) tablet 650 mg  650 mg Oral Q6H PRN Money, Lowry Ram, FNP      . alum & mag hydroxide-simeth (MAALOX/MYLANTA) 200-200-20 MG/5ML suspension 30 mL  30 mL Oral Q4H PRN Money, Lowry Ram, FNP      . docusate sodium (COLACE) capsule 200 mg  200 mg Oral BID [name], [name], MD   200 mg at 04/20/19 1634  . hydrOXYzine (ATARAX/VISTARIL) tablet 25 mg  25 mg Oral TID PRN Money, Lowry Ram, FNP   25 mg at 04/20/19 5427  . lactulose (CHRONULAC) 10 GM/15ML solution 30 g  30 g Oral BID [name], Madie Reno, MD   30 g at 04/20/19 1634  . magnesium citrate solution 1 Bottle  1 Bottle Oral Daily PRN [name], [name], MD      . magnesium hydroxide (MILK OF MAGNESIA) suspension 30 mL  30 mL Oral Daily PRN Money, Darnelle Maffucci B, FNP      . paliperidone (INVEGA) 24 hr tablet 6 mg  6 mg Oral QHS [name], Madie Reno, MD   6 mg at 04/20/19 2110  . polyethylene glycol (MIRALAX / GLYCOLAX) packet 17 g  17 g Oral Daily [name], Madie Reno, MD   17 g at 04/20/19 0623  . traZODone (DESYREL) tablet 50 mg  50 mg Oral QHS PRN Money, Lowry Ram, FNP   50 mg at 04/20/19 2110   PTA Medications: Medications Prior to Admission  Medication Sig Dispense Refill Last Dose  . dicyclomine (BENTYL) 20 MG tablet Take 1 tablet (20 mg total) by mouth every 6 (six) hours as needed. 20 tablet 0   . divalproex (DEPAKOTE) 250 MG DR tablet Take 3 tablets (750 mg total) by mouth every 12 (twelve) hours. (Patient taking  differently: Take 500-750 mg by mouth every 12 (twelve) hours. Take 2 tablets by mouth in the morning, and 3 tablets at bedtime) 180 tablet 1   . lactulose (CHRONULAC) 10 GM/15ML solution Take 30 mLs (20 g total) by mouth daily as needed for mild constipation. 120 mL 0   . OLANZapine (ZYPREXA) 15 MG tablet Take 2 tablets (30 mg total) by mouth at bedtime. 60 tablet 1     Patient Stressors: Loss of Relationship Marital or family conflict Substance abuse  Patient Strengths: Ability for insight Average or above average intelligence Physical Health  Treatment Modalities: Medication Management, Group therapy, Case management,  1 to 1 session with clinician, Psychoeducation, Recreational therapy.   Physician Treatment Plan for Primary Diagnosis: Bipolar I disorder, most recent episode (or current) manic ([place]) Long Term Goal(s): Improvement in symptoms so as ready for discharge Improvement in symptoms so as ready for discharge   Short Term Goals: Ability to verbalize feelings will improve Ability to disclose and discuss suicidal ideas Ability to demonstrate self-control will improve Compliance with prescribed medications will improve  Medication Management: Evaluate patient's response, side effects, and tolerance of medication regimen.  Therapeutic Interventions: 1 to 1 sessions, Unit Group sessions and Medication administration.  Evaluation of Outcomes: Not Met  Physician Treatment Plan for Secondary Diagnosis: Principal Problem:   Bipolar I disorder, most recent episode (or current) manic ([place]) Active Problems:   Amphetamine abuse ([place])   Noncompliance   Constipation  Long Term Goal(s): Improvement in symptoms so as ready for discharge Improvement in symptoms so as ready for discharge   Short Term Goals: Ability to verbalize feelings will improve Ability to disclose and discuss suicidal ideas Ability to demonstrate self-control will improve Compliance with prescribed  medications will improve     Medication Management: Evaluate patient's response, side effects, and tolerance of medication regimen.  Therapeutic Interventions: 1 to 1 sessions, Unit Group sessions and Medication administration.  Evaluation of Outcomes: Not Met   RN Treatment Plan for Primary Diagnosis: Bipolar I disorder, most recent episode (or current) manic ([place]) Long Term Goal(s): Knowledge of disease and therapeutic regimen to maintain health will improve  Short Term Goals: Ability to demonstrate self-control, Ability to participate in decision making will improve, Ability to identify and develop effective coping behaviors will improve and Compliance with prescribed medications will improve  Medication Management: RN will administer medications as ordered by provider, will assess and evaluate patient's response and provide education to patient for prescribed medication. RN will report any adverse and/or side effects to prescribing provider.  Therapeutic Interventions: 1 on 1 counseling sessions, Psychoeducation, Medication administration, Evaluate responses to treatment, Monitor vital signs and CBGs as ordered, Perform/monitor CIWA, COWS, AIMS and Fall Risk screenings as ordered, Perform wound care treatments as ordered.  Evaluation of Outcomes: Not Met   LCSW Treatment Plan for Primary Diagnosis: Bipolar I disorder, most recent episode (or current) manic ([place]) Long Term Goal(s): Safe transition to appropriate next level of care at discharge, Engage patient in therapeutic group addressing interpersonal concerns.  Short Term Goals: Engage patient in aftercare planning with referrals and resources and Increase skills for wellness and recovery  Therapeutic Interventions: Assess for all discharge needs, 1 to 1 time with Social worker, Explore available resources and support systems, Assess for adequacy in community support network, Educate family and significant other(s) on suicide  prevention, Complete Psychosocial Assessment, Interpersonal group therapy.  Evaluation of Outcomes: Not Met   Progress in Treatment: Attending groups: No. Participating in groups: No. Taking medication as prescribed: Yes. Toleration medication: Yes. Family/Significant other contact made: No, will contact:  Pt declined collateral contact Patient understands diagnosis: Yes. Discussing patient identified problems/goals with staff: No. Medical problems stabilized or resolved: Yes. Denies suicidal/homicidal ideation: Yes. Issues/concerns per patient self-inventory: No. Other: N/A  New problem(s) identified: No, Describe:  none  New Short Term/Long Term Goal(s): Detox, medication management for mood stabilization;  development of comprehensive mental wellness/sobriety plan.   Patient Goals:  Pt declined to attend treatment team so no goal was obtained.  Discharge Plan or Barriers: SPE pamphlet, Mobile Crisis information, and AA/NA information provided to patient for additional community support and resources. Pt has an appointment with [facility] on 04/30/19 at 3:00PM.  Reason for Continuation of Hospitalization: Medication stabilization Withdrawal symptoms  Estimated Length of Stay: Tuesday 04/22/2019  Attendees: Patient: 04/21/2019 3:02 PM  Physician: Dr Weber Cooks MD 04/21/2019 3:02 PM  Nursing:  04/21/2019 3:02 PM  RN Care Manager: 04/21/2019 3:02 PM  Social Worker: Minette Brine [place] Grayer LCSW 04/21/2019 3:02 PM  Recreational Therapist: Roanna Epley CTRS LRT 04/21/2019 3:02 PM  Other: Assunta Curtis LCSW 04/21/2019 3:02 PM  Other:  04/21/2019 3:02 PM  Other: 04/21/2019 3:02 PM    Scribe for Treatment Team: Mariann Laster Kayline Sheer, LCSW  04/21/2019 3:02 PM

## 2019-04-21 NOTE — Plan of Care (Signed)
  Problem: Education: Goal: Knowledge of [facility] Education information/materials will improve Outcome: Progressing Goal: Emotional status will improve Outcome: Progressing Goal: Mental status will improve Outcome: Progressing Goal: Verbalization of understanding the information provided will improve Outcome: Progressing   D: Patient has been isolative to room and self. Denies SI, HI and AVH. Denies symptoms of withdrawal. A: Continue to monitor for safety R: Safety maintained.

## 2019-04-21 NOTE — Progress Notes (Signed)
Patient refused medications this morning, attending notified of this.

## 2019-04-22 MED ORDER — DOCUSATE SODIUM 100 MG PO CAPS: 200.0000 mg | ORAL_CAPSULE | Freq: Two times a day (BID) | ORAL | 2 refills | Status: DC

## 2019-04-22 MED ORDER — PALIPERIDONE ER 6 MG PO TB24: 6.0000 mg | ORAL_TABLET | Freq: Every day | ORAL | 2 refills | Status: DC

## 2019-04-22 MED ORDER — TRAZODONE HCL 50 MG PO TABS: 50.0000 mg | ORAL_TABLET | Freq: Every evening | ORAL | 2 refills | Status: DC | PRN

## 2019-04-22 NOTE — Plan of Care (Signed)
Patient assessed and evaluated by psychiatrist to be fit for discharge. Pt. Care planning complete.    Problem: Education: Goal: Knowledge of [facility] Education information/materials will improve Outcome: Adequate for Discharge   Problem: Education: Goal: Ability to state activities that reduce stress will improve Outcome: Adequate for Discharge   Problem: Coping: Goal: Ability to identify and develop effective coping behavior will improve Outcome: Adequate for Discharge   Problem: Self-Concept: Goal: Ability to identify factors that promote anxiety will improve Outcome: Adequate for Discharge   Problem: Education: Goal: Ability to make informed decisions regarding treatment will improve Outcome: Adequate for Discharge   Problem: Coping: Goal: Coping ability will improve Outcome: Adequate for Discharge   Problem: Health Behavior/Discharge Planning: Goal: Identification of resources available to assist in meeting health care needs will improve Outcome: Adequate for Discharge   Problem: Medication: Goal: Compliance with prescribed medication regimen will improve Outcome: Adequate for Discharge   Problem: Self-Concept: Goal: Ability to disclose and discuss suicidal ideas will improve Outcome: Adequate for Discharge   Problem: Education: Goal: Knowledge of [facility] [facility] Education information/materials will improve Outcome: Adequate for Discharge Goal: Emotional status will improve Outcome: Adequate for Discharge Goal: Mental status will improve Outcome: Adequate for Discharge Goal: Verbalization of understanding the information provided will improve Outcome: Adequate for Discharge

## 2019-04-22 NOTE — Discharge Summary (Signed)
Physician Discharge Summary Note  Patient:  Brandon Dodson is an 38 y.o., male MRN:  [id number] DOB:  [birth year] Patient phone:  [phone] (home)  Patient address:   [address] [id number],  Total Time spent with patient: 45 minutes  Date of Admission:  04/18/2019 Date of Discharge: April 22, 2019  Reason for Admission: Admitted through the emergency room where he initially presented with pain complaints because of his agitated behavior.  Principal Problem: Bipolar I disorder, most recent episode (or current) manic ([place]) Discharge Diagnoses: Principal Problem:   Bipolar I disorder, most recent episode (or current) manic ([place]) Active Problems:   Amphetamine abuse ([place])   Noncompliance   Constipation   Past Psychiatric History: History of bipolar disorder and substance abuse with sometimes hard to distinguish episodes of mood behavior from substance-induced behavior.  Has been treated with antipsychotics in the past with some benefit and has followed up with [facility]  Past Medical History: History reviewed. No pertinent past medical history.  Past Surgical History:  Procedure Laterality Date  . DENTAL SURGERY     Family History: History reviewed. No pertinent family history. Family Psychiatric  History: See previous Social History:  Social History   Substance and Sexual Activity  Alcohol Use Yes     Social History   Substance and Sexual Activity  Drug Use Not on file    Social History   Socioeconomic History  . Marital status: Single    Spouse name: Not on file  . Number of children: Not on file  . Years of education: Not on file  . Highest education level: Not on file  Occupational History  . Not on file  Social Needs  . Financial resource strain: Not on file  . Food insecurity    Worry: Not on file    Inability: Not on file  . Transportation needs    Medical: Not on file    Non-medical: Not on file  Tobacco Use  . Smoking status: Current  Every Day Smoker    Packs/day: 1.00    Types: Cigarettes  . Smokeless tobacco: Never Used  Substance and Sexual Activity  . Alcohol use: Yes  . Drug use: Not on file  . Sexual activity: Not on file  Lifestyle  . Physical activity    Days per week: Not on file    Minutes per session: Not on file  . Stress: Not on file  Relationships  . Social Herbalist on phone: Not on file    Gets together: Not on file    Attends religious service: Not on file    Active member of club or organization: Not on file    Attends meetings of clubs or organizations: Not on file    Relationship status: Not on file  Other Topics Concern  . Not on file  Social History Narrative  . Not on file    Hospital Course: Patient was admitted to the hospital and 15-minute checks were engaged.  Patient showed no dangerous or aggressive or suicidal behavior in the hospital.  Denied any suicidal intent or plan denied any homicidal ideation.  Was cooperative with starting antipsychotic medication.  Over a couple days he opened up became more appropriately interactive.  Completely denied any homicidal ideation.  Met with representative from [facility] and agreed to follow-up outside the hospital.  No longer met commitment criteria and was discharged to outpatient care  Physical Findings: AIMS:  , ,  ,  ,  CIWA:    COWS:     Musculoskeletal: Strength & Muscle Tone: within normal limits Gait & Station: normal Patient leans: N/A  Psychiatric Specialty Exam: Physical Exam  Nursing note and vitals reviewed. Constitutional: He appears well-developed and well-nourished.  HENT:  Head: Normocephalic and atraumatic.  Eyes: Pupils are equal, round, and reactive to light. Conjunctivae are normal.  Neck: Normal range of motion.  Cardiovascular: Regular rhythm and normal heart sounds.  Respiratory: Effort normal. No respiratory distress.  GI: Soft.  Musculoskeletal: Normal range of motion.  Neurological: He is  alert.  Skin: Skin is warm and dry.  Psychiatric: Judgment normal. His affect is blunt. His speech is delayed. He is slowed. Cognition and memory are normal. He expresses no homicidal and no suicidal ideation.    Review of Systems  Constitutional: Negative.   HENT: Negative.   Eyes: Negative.   Respiratory: Negative.   Cardiovascular: Negative.   Gastrointestinal: Negative.   Musculoskeletal: Negative.   Skin: Negative.   Neurological: Negative.   Psychiatric/Behavioral: Negative.     Blood pressure 105/64, pulse 65, temperature 98.7 F (37.1 C), temperature source Oral, resp. rate 18, height '5\' 9"'  (1.753 m), weight 74.8 kg, SpO2 98 %.Body mass index is 24.35 kg/m.  General Appearance: Casual  Eye Contact:  Fair  Speech:  Normal Rate  Volume:  Decreased  Mood:  Euthymic  Affect:  Congruent  Thought Process:  Goal Directed  Orientation:  Full (Time, Place, and Person)  Thought Content:  Logical  Suicidal Thoughts:  No  Homicidal Thoughts:  No  Memory:  Immediate;   Fair Recent;   Fair Remote;   Fair  Judgement:  Fair  Insight:  Fair  Psychomotor Activity:  Normal  Concentration:  Concentration: Fair  Recall:  [place] of Knowledge:  Fair  Language:  Fair  Akathisia:  No  Handed:  Right  AIMS (if indicated):     Assets:  Physical Health  ADL's:  Intact  Cognition:  WNL  Sleep:  Number of Hours: 9     Have you used any form of tobacco in the last 30 days? (Cigarettes, Smokeless Tobacco, Cigars, and/or Pipes): Yes  Has this patient used any form of tobacco in the last 30 days? (Cigarettes, Smokeless Tobacco, Cigars, and/or Pipes) Yes, Yes, A prescription for an FDA-approved tobacco cessation medication was offered at discharge and the patient refused  Blood Alcohol level:  Lab Results  Component Value Date   [facility] <10 04/17/2019   ETH <10 10/93/2355    Metabolic Disorder Labs:  Lab Results  Component Value Date   HGBA1C 5.1 06/17/2018   MPG 100 06/17/2018    MPG 91.06 06/01/2017   No results found for: PROLACTIN Lab Results  Component Value Date   CHOL 157 06/17/2018   TRIG 170 (H) 06/17/2018   HDL 45 06/17/2018   CHOLHDL 3.5 06/17/2018   VLDL 34 06/17/2018   LDLCALC 78 06/17/2018   [place] 93 06/01/2017    See Psychiatric Specialty Exam and Suicide Risk Assessment completed by Attending Physician prior to discharge.  Discharge destination:  Home  Is patient on multiple antipsychotic therapies at discharge:  No   Has Patient had three or more failed trials of antipsychotic monotherapy by history:  No  Recommended Plan for Multiple Antipsychotic Therapies: NA  Discharge Instructions    Diet - low sodium heart healthy   Complete by: As directed    Increase activity slowly   Complete by: As directed  Allergies as of 04/22/2019   No Known Allergies     Medication List    STOP taking these medications   dicyclomine 20 MG tablet Commonly known as: Bentyl   divalproex 250 MG DR tablet Commonly known as: DEPAKOTE   lactulose 10 GM/15ML solution Commonly known as: CHRONULAC   OLANZapine 15 MG tablet Commonly known as: ZYPREXA     TAKE these medications     Indication  docusate sodium 100 MG capsule Commonly known as: COLACE Take 2 capsules (200 mg total) by mouth 2 (two) times daily.  Indication: Constipation   paliperidone 6 MG 24 hr tablet Commonly known as: INVEGA Take 1 tablet (6 mg total) by mouth at bedtime.  Indication: Manic-Depression   traZODone 50 MG tablet Commonly known as: DESYREL Take 1 tablet (50 mg total) by mouth at bedtime as needed for sleep.  Indication: [facility] Follow up on 04/30/2019.   Why: You have an appointment scheduled for Wednesday 04/30/19 at 3:00 PM. Please take hospital discharge paperwork with you to your appointment. Thank You! Contact information: [place]  [address] [phone]           Follow-up recommendations:  Activity:  Activity as tolerated Diet:  Regular diet Other:  Follow-up with [facility]  Comments: Strongly encouraged to discontinue substance abuse and continue current medicine.  Prescriptions and samples given at discharge  Signed: Alethia Berthold, MD 04/22/2019, 5:43 PM

## 2019-04-22 NOTE — Progress Notes (Signed)
D:Patient denies SI/HI at this time, contracts for safety outside the unit. Pt appears calm and cooperative, and no distress noted.  A: All Personal items in locker returned to pt. Pt escorted out of the building.  R:  Pt Statesshe will comply with discharge planning, and take MEDS as prescribed.

## 2019-04-22 NOTE — [facility]HH Suicide Risk Assessment (Signed)
[facility] Discharge Suicide Risk Assessment   Principal Problem: Bipolar I disorder, most recent episode (or current) manic ([place]) Discharge Diagnoses: Principal Problem:   Bipolar I disorder, most recent episode (or current) manic ([place]) Active Problems:   Amphetamine abuse ([place])   Noncompliance   Constipation   Total Time spent with patient: 45 minutes  Musculoskeletal: Strength & Muscle Tone: within normal limits Gait & Station: normal Patient leans: N/A  Psychiatric Specialty Exam: Review of Systems  Constitutional: Negative.   HENT: Negative.   Eyes: Negative.   Respiratory: Negative.   Cardiovascular: Negative.   Gastrointestinal: Negative.   Musculoskeletal: Negative.   Skin: Negative.   Neurological: Negative.   Psychiatric/Behavioral: Negative for depression, hallucinations, memory loss, substance abuse and suicidal ideas. The patient is not nervous/anxious and does not have insomnia.     Blood pressure 105/64, pulse 65, temperature 98.7 F (37.1 C), temperature source Oral, resp. rate 18, height 5\' 9"  (1.753 m), weight 74.8 kg, SpO2 98 %.Body mass index is 24.35 kg/m.  General Appearance: Casual  Eye Contact::  Fair  Speech:  Clear and [id number]  Volume:  Normal  Mood:  Euthymic  Affect:  Constricted  Thought Process:  Coherent  Orientation:  Full (Time, Place, and Person)  Thought Content:  Logical  Suicidal Thoughts:  No  Homicidal Thoughts:  No  Memory:  Immediate;   Fair Recent;   Fair Remote;   Fair  Judgement:  Fair  Insight:  Fair  Psychomotor Activity:  Normal  Concentration:  Fair  Recall:  [employer] of Knowledge:Fair  Language: Fair  Akathisia:  No  Handed:  Right  AIMS (if indicated):     Assets:  Desire for Improvement Physical Health  Sleep:  Number of Hours: 9  Cognition: WNL  ADL's:  Intact   Mental Status Per Nursing Assessment::   On Admission:  NA  Demographic Factors:  Caucasian and Low socioeconomic status  Loss  Factors: NA  Historical Factors: Impulsivity  Risk Reduction Factors:   Positive therapeutic relationship  Continued Clinical Symptoms:  Bipolar Disorder:   Mixed State  Cognitive Features That Contribute To Risk:  None    Suicide Risk:  Minimal: No identifiable suicidal ideation.  Patients presenting with no risk factors but with morbid ruminations; may be classified as minimal risk based on the severity of the depressive symptoms  Follow-up Information    [facility] Follow up on 04/30/2019.   Why: You have an appointment scheduled for Wednesday 04/30/19 at 3:00 PM. Please take hospital discharge paperwork with you to your appointment. Thank You! Contact information: [place] [address] [phone]           Plan Of Care/Follow-up recommendations:  Activity:  as tolerated Diet:  regular Other:  follow up with [facility]  Alethia Berthold, MD 04/22/2019, 9:43 AM

## 2019-04-22 NOTE — Progress Notes (Signed)
Patient refused scheduled medications this morning, attending aware.

## 2019-04-22 NOTE — Progress Notes (Addendum)
D: Pt denies SI/HI/AVH, able to contract for safety. Pt is pleasant and cooperative, but does express again does not want stool medications. Pt. Endorses some anxiety, but otherwise endorses a normal mood. Pt. Eager to discharge.  A: Q x 15 minute observation checks to be completed for safety till discharge. Patient was provided with education.  Patient was given/offered medications per orders. Patient  was encouraged to attend groups, participate in unit activities and continue with plan of care till discharge. Pt. Chart and plans of care reviewed. Pt. Given support and encouragement.   R: Patient is complaint with medication and unit procedures. Pt. Eating good. Pt. Awaiting discharge today.

## 2019-04-22 NOTE — Progress Notes (Signed)
  [facility] Adult Case Management Discharge Plan :  Will you be returning to the same living situation after discharge:  Yes,  pt reports that he is returning home.  At discharge, do you have transportation home?: Yes,  pt reports that his car is in the parking lot. Do you have the ability to pay for your medications: No.  Release of information consent forms completed and in the chart;  Patient's signature needed at discharge.  Patient to Follow up at: Follow-up Information    [facility] Follow up on 04/30/2019.   Why: You have an appointment scheduled for Wednesday 04/30/19 at 3:00 PM. Please take hospital discharge paperwork with you to your appointment. Thank You! Contact information: [place] [address] [phone]           Next level of care provider has access to [place] and Suicide Prevention discussed: No.  Pt declined SPE with family and/or friends.   Have you used any form of tobacco in the last 30 days? (Cigarettes, Smokeless Tobacco, Cigars, and/or Pipes): Yes  Has patient been referred to the Quitline?: Patient refused referral  Patient has been referred for addiction treatment: Pt. refused referral  Rozann Lesches, LCSW 04/22/2019, 8:51 AM

## 2019-11-20 ENCOUNTER — Encounter: Payer: Self-pay | Admitting: Emergency Medicine

## 2019-11-20 ENCOUNTER — Emergency Department
Admission: EM | Admit: 2019-11-20 | Discharge: 2019-11-20 | Disposition: A | Discharge status: Home / Self Care | Payer: Self-pay | Attending: Student | Admitting: Student

## 2019-11-20 ENCOUNTER — Emergency Department: Payer: Self-pay

## 2019-11-20 ENCOUNTER — Other Ambulatory Visit: Payer: Self-pay

## 2019-11-20 DIAGNOSIS — R6884 Jaw pain: Secondary | ICD-10-CM | POA: Insufficient documentation

## 2019-11-20 DIAGNOSIS — F17210 Nicotine dependence, cigarettes, uncomplicated: Secondary | ICD-10-CM | POA: Insufficient documentation

## 2019-11-20 MED ORDER — IBUPROFEN 800 MG PO TABS
800.0000 mg | ORAL_TABLET | Freq: Once | ORAL | Status: AC
  Administered 2019-11-20: 800 mg via ORAL
  Filled 2019-11-20: qty 1

## 2019-11-20 MED ORDER — CYCLOBENZAPRINE HCL 5 MG PO TABS: 5.0000 mg | ORAL_TABLET | Freq: Three times a day (TID) | ORAL | 0 refills | Status: DC | PRN

## 2019-11-20 MED ORDER — IBUPROFEN 800 MG PO TABS: 800.0000 mg | ORAL_TABLET | Freq: Three times a day (TID) | ORAL | 0 refills | Status: DC | PRN

## 2019-11-20 NOTE — ED Triage Notes (Signed)
Patient to ED via [facility]. Reports right-sided jaw pain x1 month. Patient reports pain worse with chewing and movement of jaw.
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
## 2020-01-25 ENCOUNTER — Encounter: Payer: Self-pay | Admitting: Emergency Medicine

## 2020-01-25 ENCOUNTER — Emergency Department
Admission: EM | Admit: 2020-01-25 | Discharge: 2020-01-26 | Disposition: A | Discharge status: Home / Self Care | Payer: Self-pay | Attending: Emergency Medicine | Admitting: Emergency Medicine

## 2020-01-25 ENCOUNTER — Other Ambulatory Visit: Payer: Self-pay

## 2020-01-25 DIAGNOSIS — Z79899 Other long term (current) drug therapy: Secondary | ICD-10-CM | POA: Insufficient documentation

## 2020-01-25 DIAGNOSIS — F17210 Nicotine dependence, cigarettes, uncomplicated: Secondary | ICD-10-CM | POA: Insufficient documentation

## 2020-01-25 DIAGNOSIS — F319 Bipolar disorder, unspecified: Secondary | ICD-10-CM | POA: Insufficient documentation

## 2020-01-25 DIAGNOSIS — F10959 Alcohol use, unspecified with alcohol-induced psychotic disorder, unspecified: Secondary | ICD-10-CM | POA: Insufficient documentation

## 2020-01-25 DIAGNOSIS — Y908 Blood alcohol level of 240 mg/100 ml or more: Secondary | ICD-10-CM | POA: Insufficient documentation

## 2020-01-25 LAB — COMPREHENSIVE METABOLIC PANEL
ALT: 45 U/L — ABNORMAL HIGH (ref 0–44)
AST: 50 U/L — ABNORMAL HIGH (ref 15–41)
Albumin: 4.2 g/dL (ref 3.5–5.0)
Alkaline Phosphatase: 91 U/L (ref 38–126)
Anion gap: 19 — ABNORMAL HIGH (ref 5–15)
BUN: 16 mg/dL (ref 6–20)
CO2: 15 mmol/L — ABNORMAL LOW (ref 22–32)
Calcium: 9 mg/dL (ref 8.9–10.3)
Chloride: 107 mmol/L (ref 98–111)
Creatinine, Ser: 1.29 mg/dL — ABNORMAL HIGH (ref 0.61–1.24)
GFR calc Af Amer: >60 mL/min (ref >60)
GFR calc non Af Amer: >60 mL/min (ref >60)
Glucose, Bld: 119 mg/dL — ABNORMAL HIGH (ref 70–99)
Potassium: 3.5 mmol/L (ref 3.5–5.1)
Sodium: 141 mmol/L (ref 135–145)
Total Bilirubin: 0.8 mg/dL (ref 0.3–1.2)
Total Protein: 7.3 g/dL (ref 6.5–8.1)

## 2020-01-25 LAB — CBC
HCT: 42.5 % (ref 39.0–52.0)
Hemoglobin: 15.2 g/dL (ref 13.0–17.0)
MCH: 30.8 pg (ref 26.0–34.0)
MCHC: 35.8 g/dL (ref 30.0–36.0)
MCV: 86 fL (ref 80.0–100.0)
Platelets: 278 10*3/uL (ref 150–400)
RBC: 4.94 MIL/uL (ref 4.22–5.81)
RDW: 12.1 % (ref 11.5–15.5)
WBC: 12.7 10*3/uL — ABNORMAL HIGH (ref 4.0–10.5)
nRBC: 0 % (ref 0.0–0.2)

## 2020-01-25 LAB — URINE DRUG SCREEN, QUALITATIVE (ARMC ONLY)
Amphetamines, Ur Screen: NOT DETECTED
Barbiturates, Ur Screen: NOT DETECTED
Benzodiazepine, Ur Scrn: NOT DETECTED
Cannabinoid 50 Ng, Ur ~~LOC~~: NOT DETECTED
Cocaine Metabolite,Ur ~~LOC~~: NOT DETECTED
MDMA (Ecstasy)Ur Screen: NOT DETECTED
Methadone Scn, Ur: NOT DETECTED
Opiate, Ur Screen: NOT DETECTED
Phencyclidine (PCP) Ur S: NOT DETECTED
Tricyclic, Ur Screen: NOT DETECTED

## 2020-01-25 LAB — SALICYLATE LEVEL: Salicylate Lvl: <7 mg/dL — ABNORMAL LOW (ref 7.0–30.0)

## 2020-01-25 LAB — ETHANOL: Alcohol, Ethyl (B): 272 mg/dL — ABNORMAL HIGH (ref <10)

## 2020-01-25 LAB — ACETAMINOPHEN LEVEL: Acetaminophen (Tylenol), Serum: <10 ug/mL — ABNORMAL LOW (ref 10–30)

## 2020-01-25 MED ORDER — LORAZEPAM 2 MG/ML IJ SOLN: 2.0000 mg | Freq: Once | INTRAMUSCULAR | Status: AC

## 2020-01-25 MED ORDER — LORAZEPAM 2 MG/ML IJ SOLN
INTRAMUSCULAR | Status: AC
  Administered 2020-01-25: 2 mg via INTRAVENOUS
  Filled 2020-01-25: qty 1

## 2020-01-25 MED ORDER — ZIPRASIDONE MESYLATE 20 MG IM SOLR
20.0000 mg | Freq: Once | INTRAMUSCULAR | Status: AC
  Administered 2020-01-25: 20 mg via INTRAMUSCULAR

## 2020-01-25 MED ORDER — DIPHENHYDRAMINE HCL 50 MG/ML IJ SOLN: 50.0000 mg | Freq: Once | INTRAMUSCULAR | Status: AC

## 2020-01-25 MED ORDER — SODIUM CHLORIDE 0.9 % IV BOLUS
1000.0000 mL | Freq: Once | INTRAVENOUS | Status: AC
  Administered 2020-01-25: 1000 mL via INTRAVENOUS

## 2020-01-25 MED ORDER — DIPHENHYDRAMINE HCL 50 MG/ML IJ SOLN
INTRAMUSCULAR | Status: AC
  Administered 2020-01-25: 50 mg via INTRAVENOUS
  Filled 2020-01-25: qty 1

## 2020-01-25 NOTE — ED Notes (Signed)
TTS unable to assess at this time, as Brandon Dodson is unable to participate with the assessor.

## 2020-01-25 NOTE — ED Notes (Signed)
TTS unable to assess at this time. Patient is not medically clear.
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
## 2020-01-25 NOTE — ED Notes (Addendum)
Pt belongings include:   1 pair of sandals, 1 pair of white socks, 1 pair of olive pant, 1 white shirt (which has to be cut), wallet, phone, 1 passport and phone charger.
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
## 2020-01-25 NOTE — ED Notes (Signed)
Patient belongings have been moved to [employer].

## 2020-01-25 NOTE — ED Notes (Signed)
Pt uncooperative with triage. Pt would not answer questions when asked by this RN.
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
## 2020-01-26 DIAGNOSIS — F10959 Alcohol use, unspecified with alcohol-induced psychotic disorder, unspecified: Secondary | ICD-10-CM

## 2020-01-26 MED ORDER — ZIPRASIDONE MESYLATE 20 MG IM SOLR
10.0000 mg | Freq: Once | INTRAMUSCULAR | Status: AC
  Administered 2020-01-26: 10 mg via INTRAMUSCULAR
  Filled 2020-01-26: qty 20
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
## 2020-01-26 NOTE — ED Notes (Signed)
Pt appears to be sleeping soundly, more difficult to arouse than 30 min prior, pt appears relaxed  Pt bilateral wrists released from soft restraints, no apparent injury to skin and no apparent detriment to CMS to fingers  All extremities assessed for circulation temperature and reflex

## 2020-01-26 NOTE — ED Notes (Signed)
Pt provided with belongings bag and advised to call this RN when he is done getting dressed to have him sign for d/c

## 2020-01-26 NOTE — ED Notes (Signed)
Pt awake, alert and oriented. Given breakfast tray.  Ready to speak with psychiatry because would like to be out of here so can get to hotel before check out and get his belongings.

## 2020-01-26 NOTE — ED Notes (Addendum)
bilat wrist restraints removed per Dr. Dolores Frame.  Pt rolled over in bed and continues to sleep.  Warm blanket provided

## 2020-01-26 NOTE — ED Notes (Signed)
Pt remains asleep. Will assess when awake. Currently on cardiac monitor. NAD at this time. On Q 15 minute checks with NT.  Waiting on psych consult.
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
## 2020-01-26 NOTE — ED Notes (Signed)
Pt resting quietly in bed with eyes closed.  Restraints x2 intact.  (lower limb restraints removed by RN).  VS WNL

## 2020-01-26 NOTE — BH Assessment (Signed)
TTS unable to assess at this time, as Mr. Brandon Dodson is unable to participate with the assessor. Patient is currently in soft restraints.

## 2020-01-26 NOTE — ED Provider Notes (Signed)
Patient has been cleared by psychiatry for discharge.   Emily Filbert, MD 01/26/20 1112

## 2020-01-26 NOTE — ED Notes (Addendum)
Assigned 1:1.  Pt resting in bed with eyes closed.  Restraints x4 intact and able to get 2 fingers between pts skin and restraint.  Skin color/temp WNL. VS WNL..  Will continue to monitor.

## 2020-01-26 NOTE — ED Notes (Signed)
Psych MD at bedside

## 2020-01-26 NOTE — ED Provider Notes (Signed)
Emergency Medicine Observation Re-evaluation Note  Brandon Dodson is a 39 y.o. male, seen on rounds today.  Pt initially presented to the ED for complaints of Psychiatric Evaluation Currently, the patient is awake, extremely agitated and furious that being offered Tylenol for his pain.  He is unable to be verbally redirected and requires IM calming agent.  Physical Exam  BP 100/64 (BP Location: Right Arm)   Pulse 85   Temp 98.3 F (36.8 C) (Oral)   Resp 15   SpO2 96%  Physical Exam  ED Course / MDM  EKG:EKG Interpretation  Date/Time:  Sunday Jan 25 2020 16:02:49 EDT Ventricular Rate:  120 PR Interval:    QRS Duration: 95 QT Interval:  327 QTC Calculation: 462 R Axis:   102 Text Interpretation: Sinus tachycardia Right axis deviation ----------unconfirmed---------- Confirmed by UNCONFIRMED, DOCTOR ([id number]), editor Tamsen Snider [phone]) on 01/25/2020 4:40:20 PM    I have reviewed the labs performed to date as well as medications administered while in observation.  Recent changes in the last 24 hours include violent outburst requiring IM calming agent. Plan  Current plan is for psychiatric disposition. Patient is under full IVC at this time.  ----------------------------------------- 2:32 AM on 01/26/2020 -----------------------------------------   Behavioral Restraint Provider Note:  Behavioral Indicators: Danger to self and Danger to others   Reaction to intervention: resisting   Review of systems: No changes   History: History and Physical reviewed and Drugs and Medications reviewed    Mental Status Exam:  Alert, aggressive, yelling  Restraint Continuation: Continue   Restraint Rationale Continuation: Continue restraints until IM calming agent kicks in and patient is more cooperative   ----------------------------------------- 4:05 AM on 01/26/2020 -----------------------------------------  Behavioral Restraint Provider Note:  Restraint  Continuation: Terminate  Feet restraints were discontinued around 3:30 AM, followed by wrist restraints at 4 AM.  Patient resting in no acute distress.         Irean Hong, MD 01/26/20 [phone]

## 2020-01-26 NOTE — ED Notes (Signed)
Pt appears to be contracting for safety, pt appears sleepy and relaxed and doesn't respond to questions without physical stimulation   Pt ankles released from soft restraints, no apparent injury to skin and no apparent detriment to CMS to toes  Left and right hands assessed for circulation temperature and reflex

## 2020-02-09 MED ORDER — BENZTROPINE MESYLATE 1 MG PO TABS
1.00 | ORAL_TABLET | ORAL | Status: DC
Start: ? — End: 2020-02-09

## 2020-02-09 MED ORDER — ACETAMINOPHEN 500 MG PO TABS
1000.00 | ORAL_TABLET | ORAL | Status: DC
Start: ? — End: 2020-02-09

## 2020-02-09 MED ORDER — LORAZEPAM 1 MG PO TABS
1.00 | ORAL_TABLET | ORAL | Status: DC
Start: ? — End: 2020-02-09

## 2020-02-09 MED ORDER — NICOTINE POLACRILEX 4 MG MT GUM
4.00 | CHEWING_GUM | OROMUCOSAL | Status: DC
Start: ? — End: 2020-02-09

## 2020-02-09 MED ORDER — IBUPROFEN 600 MG PO TABS
600.00 | ORAL_TABLET | ORAL | Status: DC
Start: ? — End: 2020-02-09

## 2020-02-09 MED ORDER — NICOTINE 21 MG/24HR TD PT24
1.00 | MEDICATED_PATCH | TRANSDERMAL | Status: DC
Start: 2020-02-10 — End: 2020-02-09

## 2020-02-09 MED ORDER — DIVALPROEX SODIUM 500 MG PO DR TAB
1000.00 | DELAYED_RELEASE_TABLET | ORAL | Status: DC
Start: 2020-02-10 — End: 2020-02-09

## 2020-02-09 MED ORDER — OLANZAPINE 5 MG PO TBDP
5.00 | ORAL_TABLET | ORAL | Status: DC
Start: ? — End: 2020-02-09

## 2020-02-09 MED ORDER — DIPHENHYDRAMINE HCL 25 MG PO CAPS
25.00 | ORAL_CAPSULE | ORAL | Status: DC
Start: ? — End: 2020-02-09

## 2020-02-09 MED ORDER — OLANZAPINE 10 MG PO TBDP
10.00 | ORAL_TABLET | ORAL | Status: DC
Start: 2020-02-09 — End: 2020-02-09
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
## 2023-12-09 ENCOUNTER — Ambulatory Visit (HOSPITAL_COMMUNITY)
Admission: EM | Admit: 2023-12-09 | Discharge: 2023-12-10 | Disposition: A | Discharge status: Other Acute Inpatient Hospital | Payer: Self-pay
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
## 2023-12-10 ENCOUNTER — Emergency Department (HOSPITAL_COMMUNITY)
Admission: EM | Admit: 2023-12-10 | Discharge: 2023-12-12 | Disposition: A | Discharge status: Home / Self Care | Payer: Self-pay | Attending: Emergency Medicine | Admitting: Emergency Medicine

## 2023-12-10 ENCOUNTER — Other Ambulatory Visit: Payer: Self-pay

## 2023-12-10 DIAGNOSIS — F3110 Bipolar disorder, current episode manic without psychotic features, unspecified: Secondary | ICD-10-CM | POA: Diagnosis present

## 2023-12-10 DIAGNOSIS — F312 Bipolar disorder, current episode manic severe with psychotic features: Secondary | ICD-10-CM | POA: Insufficient documentation

## 2023-12-10 DIAGNOSIS — R4689 Other symptoms and signs involving appearance and behavior: Secondary | ICD-10-CM

## 2023-12-10 LAB — CBC
HCT: 45.7 % (ref 39.0–52.0)
Hemoglobin: 16 g/dL (ref 13.0–17.0)
MCH: 30.2 pg (ref 26.0–34.0)
MCHC: 35 g/dL (ref 30.0–36.0)
MCV: 86.2 fL (ref 80.0–100.0)
Platelets: 280 10*3/uL (ref 150–400)
RBC: 5.3 MIL/uL (ref 4.22–5.81)
RDW: 12.3 % (ref 11.5–15.5)
WBC: 11.9 10*3/uL — ABNORMAL HIGH (ref 4.0–10.5)
nRBC: 0 % (ref 0.0–0.2)

## 2023-12-10 LAB — SALICYLATE LEVEL: Salicylate Lvl: <7 mg/dL — ABNORMAL LOW (ref 7.0–30.0)

## 2023-12-10 LAB — COMPREHENSIVE METABOLIC PANEL
ALT: 15 U/L (ref 0–44)
AST: 21 U/L (ref 15–41)
Albumin: 4.3 g/dL (ref 3.5–5.0)
Alkaline Phosphatase: 69 U/L (ref 38–126)
Anion gap: 11 (ref 5–15)
BUN: 6 mg/dL (ref 6–20)
CO2: 23 mmol/L (ref 22–32)
Calcium: 9.4 mg/dL (ref 8.9–10.3)
Chloride: 105 mmol/L (ref 98–111)
Creatinine, Ser: 0.99 mg/dL (ref 0.61–1.24)
GFR, Estimated: >60 mL/min (ref >60)
Glucose, Bld: 110 mg/dL — ABNORMAL HIGH (ref 70–99)
Potassium: 3.9 mmol/L (ref 3.5–5.1)
Sodium: 139 mmol/L (ref 135–145)
Total Bilirubin: 0.8 mg/dL (ref 0.0–1.2)
Total Protein: 7.8 g/dL (ref 6.5–8.1)

## 2023-12-10 LAB — ACETAMINOPHEN LEVEL: Acetaminophen (Tylenol), Serum: <10 ug/mL — ABNORMAL LOW (ref 10–30)

## 2023-12-10 LAB — ETHANOL: Alcohol, Ethyl (B): <10 mg/dL (ref <10)

## 2023-12-10 MED ORDER — PALIPERIDONE ER 6 MG PO TB24
6.0000 mg | ORAL_TABLET | Freq: Every day | ORAL | Status: DC
  Administered 2023-12-10: 6 mg via ORAL
  Filled 2023-12-10: qty 1

## 2023-12-10 MED ORDER — LORAZEPAM 1 MG PO TABS
1.0000 mg | ORAL_TABLET | ORAL | Status: AC | PRN
  Administered 2023-12-10: 1 mg via ORAL
  Filled 2023-12-10: qty 1

## 2023-12-10 MED ORDER — HALOPERIDOL 5 MG PO TABS: 5.0000 mg | ORAL_TABLET | Freq: Four times a day (QID) | ORAL | Status: DC | PRN

## 2023-12-10 MED ORDER — LORAZEPAM 2 MG/ML IJ SOLN: 2.0000 mg | Freq: Four times a day (QID) | INTRAMUSCULAR | Status: DC | PRN

## 2023-12-10 MED ORDER — LORAZEPAM 2 MG/ML IJ SOLN
2.0000 mg | Freq: Once | INTRAMUSCULAR | Status: DC | PRN
Start: 2023-12-10 — End: 2023-12-10
  Filled 2023-12-10: qty 1

## 2023-12-10 MED ORDER — OLANZAPINE 10 MG PO TABS
10.0000 mg | ORAL_TABLET | Freq: Every day | ORAL | Status: DC
  Administered 2023-12-10 – 2023-12-11 (×2): 10 mg via ORAL
  Filled 2023-12-10 (×2): qty 1

## 2023-12-10 MED ORDER — ZIPRASIDONE MESYLATE 20 MG IM SOLR
20.0000 mg | INTRAMUSCULAR | Status: AC | PRN
  Administered 2023-12-10: 20 mg via INTRAMUSCULAR
  Filled 2023-12-10: qty 20

## 2023-12-10 MED ORDER — HALOPERIDOL LACTATE 5 MG/ML IJ SOLN: 5.0000 mg | Freq: Four times a day (QID) | INTRAMUSCULAR | Status: DC | PRN

## 2023-12-10 MED ORDER — LORAZEPAM 1 MG PO TABS
2.0000 mg | ORAL_TABLET | Freq: Four times a day (QID) | ORAL | Status: DC | PRN
  Administered 2023-12-12: 2 mg via ORAL
  Filled 2023-12-10: qty 2

## 2023-12-10 MED ORDER — DIVALPROEX SODIUM ER 500 MG PO TB24
500.0000 mg | ORAL_TABLET | Freq: Every day | ORAL | Status: DC
  Administered 2023-12-10 – 2023-12-12 (×3): 500 mg via ORAL
  Filled 2023-12-10 (×3): qty 1

## 2023-12-10 MED ORDER — STERILE WATER FOR INJECTION IJ SOLN
INTRAMUSCULAR | Status: AC
Start: 2023-12-10 — End: 2023-12-10
  Filled 2023-12-10: qty 10

## 2023-12-10 MED ORDER — RISPERIDONE 1 MG PO TBDP
2.0000 mg | ORAL_TABLET | Freq: Three times a day (TID) | ORAL | Status: DC | PRN
  Administered 2023-12-10: 2 mg via ORAL
  Filled 2023-12-10: qty 2

## 2023-12-10 NOTE — ED Notes (Addendum)
 PT continues to deny vital signs and requests to be left alone in room, becomes agitated with attempts at conversation
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
## 2023-12-10 NOTE — ED Notes (Signed)
 PT refuses vital signs assessment

## 2023-12-10 NOTE — ED Notes (Signed)
 Pt not agreeable to TTS consult at this time. Pt states he would try again later.

## 2023-12-10 NOTE — Progress Notes (Signed)
 LCSW Progress Note  [id number]   Brandon Dodson  12/10/2023  11:33 AM  Description:   Inpatient Psychiatric Referral  Patient was recommended inpatient per Eligha Bridegroom NP There are no available beds at [facility], per [facility] Rona Ravens RN). Patient was referred to the following out of network facilities:    [facility]patient [facility] Provider Address Phone Fax  [facility] [address], [place] [address] [phone]  [facility] [address] [place] [address] [phone]  [facility] [address] [place], [place] [address] [phone]  [facility] [address] [place] [address] [phone]  [facility] [address] [place] [address] [phone]  [facility] [address], [place] [address] [phone]  [facility] [address] [place] [address] [phone]  [facility] EFAX [address] [place], [place] [phone]  [facility] [address] [place] [address] [phone]  [facility] [address], [place] [address] [phone]      Situation ongoing, CSW to continue following and update chart as more information becomes available.    [place] Reyna Lorenzi, MSW, LCSW  12/10/2023 11:33 AM

## 2023-12-10 NOTE — BH Assessment (Signed)
 Attempted to complete TTS assessment, Per RN pt is not agreeable to tts consult at this time. Advised RN to inform TTS when pt is willing to cooperate in assessment.

## 2023-12-10 NOTE — Progress Notes (Addendum)
 LCSW Progress Note   [id number]  Brandon Dodson 12/10/2023 7:10 PM   Patient was recommended inpatient per Eligha Bridegroom NP There are no available beds at [facility], per [facility] Rona Ravens RN). Patient was referred to the following out of network facilities during this pm shift:   Destination  Service Provider   Address Phone Fax Patient Preferred  [facility]   [address] [place] [address] [phone] --  [facility]   [address] [place] [address] [phone] --  [facility]   [address] [place], [place] [address] [phone] --  [facility]   [address] [place] [address] [phone] --  [facility]   [address] [place] [address] [phone] --  [facility]   [address] [place] [address] [phone] --  [facility]   [address] [place] [address] [phone] --  [facility] EFAX   [address] [place], [place] [phone] --  [facility]   [address] [place], [place] [address] [phone] --  [facility]   [address] [place] [address] [phone] --

## 2023-12-10 NOTE — ED Notes (Signed)
 PT showing aggression, slamming doors, not responding to questions and simply states 'Go away'.
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
## 2023-12-11 DIAGNOSIS — F3110 Bipolar disorder, current episode manic without psychotic features, unspecified: Secondary | ICD-10-CM | POA: Diagnosis not present

## 2023-12-11 MED ORDER — IBUPROFEN 400 MG PO TABS
400.0000 mg | ORAL_TABLET | Freq: Once | ORAL | Status: AC | PRN
  Administered 2023-12-11: 400 mg via ORAL
  Filled 2023-12-11: qty 1
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
## 2023-12-11 NOTE — ED Notes (Signed)
 Patient ambulated to the bathroom; RN attempted to hand patient the urine cup for sample; pt ignored RN and continued to urinated in toilet; sitter remains sitting in front of patient's room-Monique,RN

## 2023-12-11 NOTE — Progress Notes (Signed)
 LCSW Progress Note  [id number]   Brandon Dodson  12/11/2023  11:12 AM  Description:   Inpatient Psychiatric Referral  Patient was recommended inpatient per Eligha Bridegroom NP. There are no available beds at [facility], per [facility] Rona Ravens RN). Patient was referred to the following out of network facilities: [facility] Provider Address Phone Fax  [facility] [address], [place] [address] [phone]  [facility] [address] [place] [address] [phone]  [facility] Center-Adult [address] [place], [place] [address] [phone]  [facility] [address] [place] [address] [phone]  [facility] [address], [place] [address] [phone]  [facility] [address] [place] [address] [phone]  [facility] [address] [place] [address] [phone]  [facility] EFAX [address] [place], [place] [phone]  [facility] [address] [place] [address] [phone]  [facility] [address] [place] [address] [phone]      Situation ongoing, CSW to continue following and update chart as more information becomes available.      [place] Deshunda Thackston, MSW, LCSW  12/11/2023 11:12 AM

## 2023-12-11 NOTE — Progress Notes (Addendum)
 LCSW Progress Note   [id number]  Rollin Kotowski 12/11/2023 9:25 PM   Patient was recommended inpatient per Eligha Bridegroom NP There are no available beds at [facility], per [facility] Rona Ravens RN). Patient was referred to the following out of network facilities during this pm shift:   Destination  Service Provider   Address Phone Fax Patient Preferred  [facility]   [address] [place] [address] [phone] --  [facility]   [address] [place] [address] [phone] --  [facility] Center-Adult   [address] [place], [place] [address] [phone] --  [facility]   [address] [place] [address] [phone] --  [facility]   [address] [place] [address] [phone] --  [facility]   [address] [place] [address] [phone] --  [facility]   [address] [place] [address] [phone] --  [facility] EFAX   [address] [place], [place] [phone] --  [facility]   [address] [place], [place] [address] [phone] --  [facility]   [address] [place] [address] [phone] --

## 2023-12-11 NOTE — ED Notes (Signed)
IVC CURRENT

## 2023-12-11 NOTE — ED Notes (Addendum)
 Patient had not voided today, bladder scan was perform he had 192 in his bladder.

## 2023-12-11 NOTE — ED Provider Notes (Signed)
 Emergency Medicine Observation Re-evaluation Note  Brandon Dodson is a 43 y.o. male, seen on rounds today.  Pt initially presented to the ED for complaints of Aggressive Behavior Currently, the patient is sleeping.  Physical Exam  BP 94/67 (BP Location: Left Arm)   Pulse 79   Temp 98.1 F (36.7 C) (Oral)   Resp 18   Ht 5\' 9"  (1.753 m)   Wt 74.8 kg   SpO2 100%   BMI 24.35 kg/m  Physical Exam General: nad Cardiac: regular Lungs: clear Psych: guarded  ED Course / MDM  EKG:   I have reviewed the labs performed to date as well as medications administered while in observation.  Recent changes in the last 24 hours include none.  Wants to be left alone.  Plan  Current plan is for IVC and placement.    Gwyneth Sprout, MD 12/11/23 [phone]

## 2023-12-12 LAB — RAPID URINE DRUG SCREEN, HOSP PERFORMED
Amphetamines: NOT DETECTED
Barbiturates: NOT DETECTED
Benzodiazepines: NOT DETECTED
Cocaine: NOT DETECTED
Opiates: NOT DETECTED
Tetrahydrocannabinol: NOT DETECTED

## 2023-12-12 NOTE — ED Notes (Signed)
 IVC'd 12/10/23, exp 12/17/23; IVC docs in purple [place]

## 2023-12-12 NOTE — Progress Notes (Signed)
 LCSW Progress Note  [id number]   Brandon Dodson  12/12/2023  11:10 AM  Description:   Inpatient Psychiatric Referral  Patient was recommended inpatient per Eligha Bridegroom NP). There are no available beds at [facility], per [facility] Victory Dakin RN. Patient was referred to the following out of network facilities:   [facility] Provider Address Phone Fax  [facility] [address] [place] [address] [phone]  [facility] [address] [place] [address] [phone]  [facility] Center-Adult [address] [place], [place] [address] [phone]  [facility] [address] [place] [address] [phone]  [facility] [address] [place] [address] [phone]  [facility] [address], [place] [address] [phone]  [facility] [address] [place] [address] [phone]  [facility] EFAX [address] [place], [place] [phone]  [facility] [address] [place] [address] [phone]  [facility] [address], [place] [address] [phone]      Situation ongoing, CSW to continue following and update chart as more information becomes available.      [place] Charo Philipp, MSW, LCSW  12/12/2023 11:10 AM

## 2023-12-12 NOTE — ED Provider Notes (Signed)
 Patient accepted to [facility], Dr. Trudee Grip accepts.   Brandon Munch, MD 12/12/23 1350

## 2023-12-12 NOTE — ED Provider Notes (Signed)
 Emergency Medicine Observation Re-evaluation Note  Brandon Dodson is a 43 y.o. male, seen on rounds today.  Pt initially presented to the ED for complaints of Aggressive Behavior Currently, the patient is resting.  Physical Exam  BP 115/65 (BP Location: Right Arm)   Pulse 89   Temp 98.9 F (37.2 C) (Oral)   Resp 16   Ht 5\' 9"  (1.753 m)   Wt 74.8 kg   SpO2 97%   BMI 24.35 kg/m  Physical Exam General:, No distress Cardiac: Regular rate Lungs: No increased work of breathing Psych: Calm  ED Course / MDM  EKG:   I have reviewed the labs performed to date as well as medications administered while in observation.  Recent changes in the last 24 hours include none.  Plan  Current plan is for psych placement.    Gerhard Munch, MD 12/12/23 1052

## 2023-12-12 NOTE — Progress Notes (Signed)
 Pt has been accepted to [facility] on 12/12/2023 Bed assignment: 5 [address] UNIT   Pt meets inpatient criteria per: Arsenio Loader NP   Attending Physician will be: Joylene Igo MD   Report can be called to: [phone]   Pt can arrive ASAP   Care Team Notified: Arsenio Loader NP, Rozell Searing RN    [place] Elonna Mcfarlane LCSW-A   12/12/2023 11:59 AM

## 2023-12-12 NOTE — ED Notes (Signed)
 PT refuses to provide urine sample
